# Patient Record
Sex: Female | Born: 1995 | Race: Black or African American | Hispanic: No | Marital: Single | Smoking: Former smoker
Health system: Southern US, Community
[De-identification: ages and names within clinical notes are randomized; demographics above are authoritative.]

## PROBLEM LIST (undated history)

## (undated) DIAGNOSIS — J45909 Unspecified asthma, uncomplicated: Secondary | ICD-10-CM

---

## 2019-08-29 ENCOUNTER — Inpatient Hospital Stay (HOSPITAL_COMMUNITY)
Admission: EM | Admit: 2019-08-29 | Discharge: 2019-09-04 | DRG: 869 | Disposition: A | Discharge status: Home Health | Payer: BLUE CROSS/BLUE SHIELD | Attending: Family Medicine | Admitting: Family Medicine

## 2019-08-29 ENCOUNTER — Encounter (HOSPITAL_COMMUNITY): Payer: Self-pay | Admitting: *Deleted

## 2019-08-29 ENCOUNTER — Other Ambulatory Visit: Payer: Self-pay

## 2019-08-29 DIAGNOSIS — E876 Hypokalemia: Secondary | ICD-10-CM | POA: Diagnosis not present

## 2019-08-29 DIAGNOSIS — Z20822 Contact with and (suspected) exposure to covid-19: Secondary | ICD-10-CM | POA: Diagnosis present

## 2019-08-29 DIAGNOSIS — X500XXA Overexertion from strenuous movement or load, initial encounter: Secondary | ICD-10-CM

## 2019-08-29 DIAGNOSIS — A5489 Other gonococcal infections: Principal | ICD-10-CM | POA: Diagnosis present

## 2019-08-29 DIAGNOSIS — J45909 Unspecified asthma, uncomplicated: Secondary | ICD-10-CM

## 2019-08-29 DIAGNOSIS — R52 Pain, unspecified: Secondary | ICD-10-CM

## 2019-08-29 DIAGNOSIS — M2550 Pain in unspecified joint: Secondary | ICD-10-CM

## 2019-08-29 DIAGNOSIS — M659 Synovitis and tenosynovitis, unspecified: Secondary | ICD-10-CM | POA: Diagnosis present

## 2019-08-29 DIAGNOSIS — L989 Disorder of the skin and subcutaneous tissue, unspecified: Secondary | ICD-10-CM

## 2019-08-29 DIAGNOSIS — R262 Difficulty in walking, not elsewhere classified: Secondary | ICD-10-CM | POA: Diagnosis present

## 2019-08-29 DIAGNOSIS — A549 Gonococcal infection, unspecified: Secondary | ICD-10-CM

## 2019-08-29 DIAGNOSIS — Y93F2 Activity, caregiving, lifting: Secondary | ICD-10-CM

## 2019-08-29 DIAGNOSIS — S46912A Strain of unspecified muscle, fascia and tendon at shoulder and upper arm level, left arm, initial encounter: Secondary | ICD-10-CM | POA: Diagnosis present

## 2019-08-29 DIAGNOSIS — J4520 Mild intermittent asthma, uncomplicated: Secondary | ICD-10-CM | POA: Diagnosis present

## 2019-08-29 DIAGNOSIS — A419 Sepsis, unspecified organism: Secondary | ICD-10-CM

## 2019-08-29 DIAGNOSIS — M25462 Effusion, left knee: Secondary | ICD-10-CM

## 2019-08-29 DIAGNOSIS — M25562 Pain in left knee: Secondary | ICD-10-CM

## 2019-08-29 DIAGNOSIS — A5409 Other gonococcal infection of lower genitourinary tract: Secondary | ICD-10-CM | POA: Diagnosis present

## 2019-08-29 HISTORY — DX: Unspecified asthma, uncomplicated: J45.909

## 2019-08-29 LAB — COMPREHENSIVE METABOLIC PANEL
ALT: 64 U/L — ABNORMAL HIGH (ref 0–44)
AST: 41 U/L (ref 15–41)
Albumin: 3.8 g/dL (ref 3.5–5.0)
Alkaline Phosphatase: 89 U/L (ref 38–126)
Anion gap: 10 (ref 5–15)
BUN: 9 mg/dL (ref 6–20)
CO2: 25 mmol/L (ref 22–32)
Calcium: 8.7 mg/dL — ABNORMAL LOW (ref 8.9–10.3)
Chloride: 102 mmol/L (ref 98–111)
Creatinine, Ser: 0.63 mg/dL (ref 0.44–1.00)
GFR calc Af Amer: >60 mL/min (ref >60)
GFR calc non Af Amer: >60 mL/min (ref >60)
Glucose, Bld: 84 mg/dL (ref 70–99)
Potassium: 3.2 mmol/L — ABNORMAL LOW (ref 3.5–5.1)
Sodium: 137 mmol/L (ref 135–145)
Total Bilirubin: 0.8 mg/dL (ref 0.3–1.2)
Total Protein: 7.6 g/dL (ref 6.5–8.1)

## 2019-08-29 LAB — CBC WITH DIFFERENTIAL/PLATELET
Abs Immature Granulocytes: 0.1 10*3/uL — ABNORMAL HIGH (ref 0.00–0.07)
Basophils Absolute: 0 10*3/uL (ref 0.0–0.1)
Basophils Relative: 0 %
Eosinophils Absolute: 0 10*3/uL (ref 0.0–0.5)
Eosinophils Relative: 0 %
HCT: 40.1 % (ref 36.0–46.0)
Hemoglobin: 13.5 g/dL (ref 12.0–15.0)
Immature Granulocytes: 1 %
Lymphocytes Relative: 14 %
Lymphs Abs: 1.5 10*3/uL (ref 0.7–4.0)
MCH: 30.3 pg (ref 26.0–34.0)
MCHC: 33.7 g/dL (ref 30.0–36.0)
MCV: 90.1 fL (ref 80.0–100.0)
Monocytes Absolute: 0.9 10*3/uL (ref 0.1–1.0)
Monocytes Relative: 9 %
Neutro Abs: 8.2 10*3/uL — ABNORMAL HIGH (ref 1.7–7.7)
Neutrophils Relative %: 76 %
Platelets: 216 10*3/uL (ref 150–400)
RBC: 4.45 MIL/uL (ref 3.87–5.11)
RDW: 11.2 % — ABNORMAL LOW (ref 11.5–15.5)
WBC: 10.7 10*3/uL — ABNORMAL HIGH (ref 4.0–10.5)
nRBC: 0 % (ref 0.0–0.2)

## 2019-08-29 LAB — HCG, QUANTITATIVE, PREGNANCY: hCG, Beta Chain, Quant, S: <1 m[IU]/mL (ref <5)

## 2019-08-29 LAB — URINALYSIS, MICROSCOPIC (REFLEX)
Bacteria, UA: NONE SEEN
RBC / HPF: NONE SEEN RBC/hpf (ref 0–5)

## 2019-08-29 LAB — HIV ANTIBODY (ROUTINE TESTING W REFLEX): HIV Screen 4th Generation wRfx: NONREACTIVE

## 2019-08-29 LAB — URINALYSIS, ROUTINE W REFLEX MICROSCOPIC
Glucose, UA: NEGATIVE mg/dL
Hgb urine dipstick: NEGATIVE
Ketones, ur: >80 mg/dL — AB
Nitrite: NEGATIVE
Specific Gravity, Urine: 1.02 (ref 1.005–1.030)
pH: 6.5 (ref 5.0–8.0)

## 2019-08-29 LAB — CK: Total CK: 38 U/L (ref 38–234)

## 2019-08-29 LAB — WET PREP, GENITAL
Sperm: NONE SEEN
Trich, Wet Prep: NONE SEEN
Yeast Wet Prep HPF POC: NONE SEEN

## 2019-08-29 MED ORDER — SODIUM CHLORIDE 0.9 % IV SOLN
1.0000 g | Freq: Once | INTRAVENOUS | Status: AC
  Administered 2019-08-29: 21:00:00 1 g via INTRAVENOUS
  Filled 2019-08-29: qty 10

## 2019-08-29 MED ORDER — SENNOSIDES-DOCUSATE SODIUM 8.6-50 MG PO TABS: 1.0000 | ORAL_TABLET | Freq: Every evening | ORAL | Status: DC | PRN

## 2019-08-29 MED ORDER — SODIUM CHLORIDE 0.9 % IV SOLN: INTRAVENOUS | Status: AC

## 2019-08-29 MED ORDER — ONDANSETRON HCL 4 MG/2ML IJ SOLN: 4.0000 mg | Freq: Four times a day (QID) | INTRAMUSCULAR | Status: DC | PRN

## 2019-08-29 MED ORDER — FENTANYL CITRATE (PF) 100 MCG/2ML IJ SOLN
50.0000 ug | Freq: Once | INTRAMUSCULAR | Status: AC
  Administered 2019-08-29: 18:00:00 50 ug via INTRAVENOUS
  Filled 2019-08-29: qty 2

## 2019-08-29 MED ORDER — POTASSIUM CHLORIDE CRYS ER 20 MEQ PO TBCR
40.0000 meq | EXTENDED_RELEASE_TABLET | Freq: Once | ORAL | Status: AC
  Administered 2019-08-29: 21:00:00 40 meq via ORAL
  Filled 2019-08-29: qty 2

## 2019-08-29 MED ORDER — SODIUM CHLORIDE 0.9 % IV BOLUS
1000.0000 mL | Freq: Once | INTRAVENOUS | Status: AC
  Administered 2019-08-29: 20:00:00 1000 mL via INTRAVENOUS

## 2019-08-29 MED ORDER — SODIUM CHLORIDE 0.9 % IV BOLUS
1000.0000 mL | Freq: Once | INTRAVENOUS | Status: AC
  Administered 2019-08-29: 18:00:00 1000 mL via INTRAVENOUS

## 2019-08-29 MED ORDER — SODIUM CHLORIDE 0.9 % IV SOLN
1.0000 g | INTRAVENOUS | Status: DC
  Administered 2019-08-30: 1 g via INTRAVENOUS
  Filled 2019-08-29: qty 1
  Filled 2019-08-29: qty 10

## 2019-08-29 MED ORDER — ONDANSETRON HCL 4 MG PO TABS
4.0000 mg | ORAL_TABLET | Freq: Four times a day (QID) | ORAL | Status: DC | PRN
  Filled 2019-08-29: qty 1

## 2019-08-29 MED ORDER — HYDROCODONE-ACETAMINOPHEN 5-325 MG PO TABS
1.0000 | ORAL_TABLET | ORAL | Status: DC | PRN
  Administered 2019-08-29 – 2019-08-30 (×5): 2 via ORAL
  Administered 2019-08-31: 1 via ORAL
  Administered 2019-08-31 (×3): 2 via ORAL
  Administered 2019-08-31: 1 via ORAL
  Administered 2019-09-01 (×2): 2 via ORAL
  Filled 2019-08-29 (×11): qty 2

## 2019-08-29 MED ORDER — ACETAMINOPHEN 325 MG PO TABS
650.0000 mg | ORAL_TABLET | Freq: Four times a day (QID) | ORAL | Status: DC | PRN
  Administered 2019-09-02 – 2019-09-04 (×3): 650 mg via ORAL
  Filled 2019-08-29 (×3): qty 2

## 2019-08-29 MED ORDER — ACETAMINOPHEN 650 MG RE SUPP: 650.0000 mg | Freq: Four times a day (QID) | RECTAL | Status: DC | PRN

## 2019-08-29 MED ORDER — ALBUTEROL SULFATE (2.5 MG/3ML) 0.083% IN NEBU: 2.5000 mg | INHALATION_SOLUTION | Freq: Four times a day (QID) | RESPIRATORY_TRACT | Status: DC | PRN

## 2019-08-29 MED ORDER — MORPHINE SULFATE (PF) 4 MG/ML IV SOLN
4.0000 mg | INTRAVENOUS | Status: AC | PRN
  Administered 2019-08-30 – 2019-08-31 (×2): 4 mg via INTRAVENOUS
  Filled 2019-08-29 (×2): qty 1

## 2019-08-29 MED ORDER — ACETAMINOPHEN 325 MG PO TABS
650.0000 mg | ORAL_TABLET | Freq: Once | ORAL | Status: AC
  Administered 2019-08-29: 650 mg via ORAL
  Filled 2019-08-29: qty 2

## 2019-08-29 MED ORDER — DOXYCYCLINE HYCLATE 100 MG PO TABS
100.0000 mg | ORAL_TABLET | Freq: Two times a day (BID) | ORAL | Status: DC
  Administered 2019-08-29 – 2019-09-01 (×7): 100 mg via ORAL
  Filled 2019-08-29 (×10): qty 1

## 2019-08-29 MED ORDER — LORAZEPAM 0.5 MG PO TABS
0.5000 mg | ORAL_TABLET | Freq: Once | ORAL | Status: AC
  Administered 2019-08-29: 17:00:00 0.5 mg via ORAL
  Filled 2019-08-29: qty 1

## 2019-08-29 MED ORDER — FENTANYL CITRATE (PF) 100 MCG/2ML IJ SOLN
50.0000 ug | Freq: Once | INTRAMUSCULAR | Status: AC
  Administered 2019-08-29: 21:00:00 50 ug via INTRAVENOUS
  Filled 2019-08-29: qty 2

## 2019-08-29 MED ORDER — ALBUTEROL SULFATE HFA 108 (90 BASE) MCG/ACT IN AERS: 2.0000 | INHALATION_SPRAY | Freq: Four times a day (QID) | RESPIRATORY_TRACT | Status: DC | PRN

## 2019-08-29 NOTE — ED Notes (Signed)
ED TO INPATIENT HANDOFF REPORT  ED Nurse Name and Phone #: Clarise Cruz [phone]  S Name/Age/Gender Nancy Hudson 24 y.o. female Room/Bed: WA24/WA24  Code Status   Code Status: Not on file  Home/SNF/Other Home Patient oriented to: self, place, time and situation Is this baseline? Yes   Triage Complete: Triage complete  Chief Complaint Sepsis [facility]) [A41.9]  Triage Note BIB EMS due to ? Allergic reaction to Diclofenac or Flexeril, Started on Tuesday due to shoulder injury, on Wednesday noticed clear pustular bumps on ext only, also reports joint pain, random joint swelling. Itching and burning. No SHOB. 134/90-100-20-100%     Allergies No Known Allergies  Level of Care/Admitting Diagnosis ED Disposition    ED Disposition Condition Comment   Admit  Hospital Area: [facility] [[id number]]  Level of Care: Med-Surg [16]  Covid Evaluation: Asymptomatic Screening Protocol (No Symptoms)  Diagnosis: Sepsis [facility]) [[id number]]  Admitting Physician: Vianne Bulls [[id number]]  Attending Physician: Vianne Bulls [[id number]]       B Medical/Surgery History Past Medical History:  Diagnosis Date  . Asthma    History reviewed. No pertinent surgical history.   A IV Location/Drains/Wounds Patient Lines/Drains/Airways Status   Active Line/Drains/Airways    Name:   Placement date:   Placement time:   Site:   Days:   Peripheral IV 08/29/19 Left Arm   08/29/19    1750    Arm   less than 1          Intake/Output Last 24 hours  Intake/Output Summary (Last 24 hours) at 08/29/2019 2140 Last data filed at 08/29/2019 2135 Gross per 24 hour  Intake 2103.62 ml  Output --  Net 2103.62 ml    Labs/Imaging Results for orders placed or performed during the hospital encounter of 08/29/19 (from the past 48 hour(s))  Wet prep, genital     Status: Abnormal   Collection Time: 08/29/19  5:08 PM   Specimen: PATH Cytology Cervicovaginal Ancillary Only  Result Value Ref Range   Yeast  Wet Prep HPF POC NONE SEEN NONE SEEN   Trich, Wet Prep NONE SEEN NONE SEEN   Clue Cells Wet Prep HPF POC PRESENT (A) NONE SEEN   WBC, Wet Prep HPF POC FEW (A) NONE SEEN   Sperm NONE SEEN     Comment: Performed at [facility], [place] [address] [place], [place] [address]  Comprehensive metabolic panel     Status: Abnormal   Collection Time: 08/29/19  5:15 PM  Result Value Ref Range   Sodium 137 135 - 145 mmol/L   Potassium 3.2 (L) 3.5 - 5.1 mmol/L   Chloride 102 98 - 111 mmol/L   CO2 25 22 - 32 mmol/L   Glucose, Bld 84 70 - 99 mg/dL    Comment: Glucose reference range applies only to samples taken after fasting for at least 8 hours.   BUN 9 6 - 20 mg/dL   Creatinine, Ser 0.63 0.44 - 1.00 mg/dL   Calcium 8.7 (L) 8.9 - 10.3 mg/dL   Total Protein 7.6 6.5 - 8.1 g/dL   Albumin 3.8 3.5 - 5.0 g/dL   AST 41 15 - 41 U/L   ALT 64 (H) 0 - 44 U/L   Alkaline Phosphatase 89 38 - 126 U/L   Total Bilirubin 0.8 0.3 - 1.2 mg/dL   GFR calc non Af Amer >60 >60 mL/min   GFR calc Af Amer >60 >60 mL/min   Anion gap 10 5 -  15    Comment: Performed at [facility], [address] [place], [place] [address]  CBC with Differential     Status: Abnormal   Collection Time: 08/29/19  5:15 PM  Result Value Ref Range   WBC 10.7 (H) 4.0 - 10.5 K/uL   RBC 4.45 3.87 - 5.11 MIL/uL   Hemoglobin 13.5 12.0 - 15.0 g/dL   HCT 58.0 99.8 - 33.8 %   MCV 90.1 80.0 - 100.0 fL   MCH 30.3 26.0 - 34.0 pg   MCHC 33.7 30.0 - 36.0 g/dL   RDW 25.0 (L) 53.9 - 76.7 %   Platelets 216 150 - 400 K/uL   nRBC 0.0 0.0 - 0.2 %   Neutrophils Relative % 76 %   Neutro Abs 8.2 (H) 1.7 - 7.7 K/uL   Lymphocytes Relative 14 %   Lymphs Abs 1.5 0.7 - 4.0 K/uL   Monocytes Relative 9 %   Monocytes Absolute 0.9 0.1 - 1.0 K/uL   Eosinophils Relative 0 %   Eosinophils Absolute 0.0 0.0 - 0.5 K/uL   Basophils Relative 0 %   Basophils Absolute 0.0 0.0 - 0.1 K/uL   Immature Granulocytes 1 %   Abs Immature  Granulocytes 0.10 (H) 0.00 - 0.07 K/uL    Comment: Performed at [facility], [address], [place], [place] [address]  CK     Status: None   Collection Time: 08/29/19  5:15 PM  Result Value Ref Range   Total CK 38 38 - 234 U/L    Comment: Performed at [facility], [address], [place], [place] [address]  HIV Antibody (routine testing w rflx)     Status: None   Collection Time: 08/29/19  5:15 PM  Result Value Ref Range   HIV Screen 4th Generation wRfx NON REACTIVE NON REACTIVE    Comment: Performed at [facility], [address] [place], [place] [address]  hCG, quantitative, pregnancy     Status: None   Collection Time: 08/29/19  5:43 PM  Result Value Ref Range   hCG, Beta Chain, Quant, S <1 <5 mIU/mL    Comment:          GEST. AGE      CONC.  (mIU/mL)   <=1 WEEK        5 - 50     2 WEEKS       50 - 500     3 WEEKS       100 - 10,000     4 WEEKS     1,000 - 30,000     5 WEEKS     3,500 - 115,000   6-8 WEEKS     12,000 - 270,000    12 WEEKS     15,000 - 220,000        FEMALE AND NON-PREGNANT FEMALE:     LESS THAN 5 mIU/mL Performed at [facility], [address], [place], [place] [address]   Urinalysis, Routine w reflex microscopic     Status: Abnormal   Collection Time: 08/29/19  6:26 PM  Result Value Ref Range   Color, Urine YELLOW YELLOW   APPearance HAZY (A) CLEAR   Specific Gravity, Urine 1.020 1.005 - 1.030   pH 6.5 5.0 - 8.0   Glucose, UA NEGATIVE NEGATIVE mg/dL   Hgb urine dipstick NEGATIVE NEGATIVE   Bilirubin Urine SMALL (A) NEGATIVE   Ketones, ur >80 (A) NEGATIVE mg/dL  Protein, ur TRACE (A) NEGATIVE mg/dL   Nitrite NEGATIVE NEGATIVE   Leukocytes,Ua SMALL (A) NEGATIVE    Comment: Performed at [facility], [address] [place], [place] [address]  Urinalysis, Microscopic (reflex)     Status: Abnormal   Collection Time: 08/29/19  6:26 PM  Result Value Ref Range   RBC /  HPF NONE SEEN 0 - 5 RBC/hpf   WBC, UA 11-20 0 - 5 WBC/hpf   Bacteria, UA NONE SEEN NONE SEEN   Squamous Epithelial / LPF 6-10 0 - 5   Non Squamous Epithelial PRESENT (A) NONE SEEN   Amorphous Crystal PRESENT     Comment: Performed at [facility], [address], [place], [place] [address]   No results found.  Pending Labs [employer] (From admission, onward)    Start     Ordered   08/29/19 2007  SARS CORONAVIRUS 2 (TAT 6-24 HRS) Nasopharyngeal Nasopharyngeal Swab  (Tier 3 (TAT 6-24 hrs))  Once,   STAT    Question Answer Comment  Is this test for diagnosis or screening Screening   Symptomatic for COVID-19 as defined by CDC No   Hospitalized for COVID-19 No   Admitted to ICU for COVID-19 No   Previously tested for COVID-19 No   Resident in a congregate (group) care setting No   Employed in healthcare setting No   Pregnant No      08/29/19 2018   08/29/19 1642  Blood culture (routine x 2)  BLOOD CULTURE X 2,   STAT     08/29/19 1641   08/29/19 1633  RPR  ONCE - STAT,   STAT     08/29/19 1633   Signed and Held  Comprehensive metabolic panel  Tomorrow morning,   R     Signed and Held   Signed and Held  CBC WITH DIFFERENTIAL  Tomorrow morning,   R     Signed and Held          Vitals/Pain Today's Vitals   08/29/19 2000 08/29/19 2100 08/29/19 2130 08/29/19 2135  BP: (!) 128/94 (!) 134/99 (!) 137/105   Pulse: 100 (!) 105 90   Resp: (!) 25 20 15    Temp:      TempSrc:      SpO2: 100% 98% 100%   Height:      PainSc:    6     Isolation Precautions No active isolations  Medications Medications  cefTRIAXone (ROCEPHIN) 1 g in sodium chloride 0.9 % 100 mL IVPB (has no administration in time range)  doxycycline (VIBRA-TABS) tablet 100 mg (has no administration in time range)  fentaNYL (SUBLIMAZE) injection 50 mcg (50 mcg Intravenous Given 08/29/19 1816)  acetaminophen (TYLENOL) tablet 650 mg (650 mg Oral Given 08/29/19 1721)  sodium chloride 0.9 % bolus  1,000 mL (0 mLs Intravenous Stopped 08/29/19 2007)  LORazepam (ATIVAN) tablet 0.5 mg (0.5 mg Oral Given 08/29/19 1721)  sodium chloride 0.9 % bolus 1,000 mL (0 mLs Intravenous Stopped 08/29/19 2042)  cefTRIAXone (ROCEPHIN) 1 g in sodium chloride 0.9 % 100 mL IVPB ( Intravenous Stopped 08/29/19 2119)  fentaNYL (SUBLIMAZE) injection 50 mcg (50 mcg Intravenous Given 08/29/19 2050)  potassium chloride SA (KLOR-CON) CR tablet 40 mEq (40 mEq Oral Given 08/29/19 2051)    Mobility non-ambulatory Low fall risk   Focused Assessments    R Recommendations: See Admitting Provider Note  Report given to:   Additional Notes: Purewick present

## 2019-08-29 NOTE — ED Notes (Signed)
PA at bedside.

## 2019-08-29 NOTE — ED Triage Notes (Signed)
BIB EMS due to ? Allergic reaction to Diclofenac or Flexeril, Started on Tuesday due to shoulder injury, on Wednesday noticed clear pustular bumps on ext only, also reports joint pain, random joint swelling. Itching and burning. No SHOB. 134/90-100-20-100%

## 2019-08-29 NOTE — ED Notes (Signed)
Attempted to call report, nurse stated she needed a few more minutes

## 2019-08-29 NOTE — H&P (Signed)
History and Physical    Nancy Hudson ZHG:[id number] DOB: [birth year] DOA: 08/29/2019  PCP: Patient, No Pcp Per   Patient coming from: Home   Chief Complaint: Joint aches, rash, chills   HPI: Nancy Hudson is a 24 y.o. female with medical history significant for asthma and recent right shoulder strain, now presenting to emergency department with approximately 5 days of joint aches, rash, chills, and malaise.  Patient had just started diclofenac for a right shoulder strain when she noted small pustules appear over her elbow, knee, hand, and foot.  She went on to develop chills, general malaise, and diffuse arthralgias.  She denies any cough, dysuria, abdominal or flank pain, or vaginal discharge.  ED Course: Upon arrival to the ED, patient is found to be febrile to 38.1 C, saturating well on room air, slightly tachycardic, and with stable blood pressure.  Chemistry panel notable for potassium 3.2 and CBC with leukocytosis to 10,700.  Blood cultures were collected and the patient was treated with oral potassium, 2 L of saline, 1 g of Rocephin.  Covid PCR, HIV, and RPR were ordered from the ED and a urogenital swab was sent for gonorrhea and Chlamydia testing.  Infectious disease was consulted by the ED physician and medical admission was recommended for ongoing evaluation and management.  Review of Systems:  All other systems reviewed and apart from HPI, are negative.  Past Medical History:  Diagnosis Date  . Asthma     History reviewed. No pertinent surgical history.   has no history on file for tobacco, alcohol, and drug.  No Known Allergies  History reviewed. No pertinent family history.   Prior to Admission medications   Medication Sig Start Date End Date Taking? Authorizing Provider  acetaminophen (TYLENOL) 500 MG tablet Take 500 mg by mouth every 6 (six) hours as needed for mild pain or moderate pain.   Yes [provider]  cetirizine (ZYRTEC) 10 MG tablet Take 10 mg by  mouth daily as needed for allergies.   Yes [provider]  cyclobenzaprine (FLEXERIL) 10 MG tablet Take 10 mg by mouth 3 (three) times daily as needed for muscle spasms.  08/25/19  Yes [provider]  diclofenac (CATAFLAM) 50 MG tablet Take 50 mg by mouth 3 (three) times daily as needed for pain. 08/25/19  Yes [provider]  diphenhydrAMINE HCl (BENADRYL ALLERGY PO) Take 1 Dose by mouth every 4 (four) hours as needed (itching, allergic reaction).    Yes [provider]  fluticasone (FLONASE) 50 MCG/ACT nasal spray Place 1-2 sprays into both nostrils daily as needed for allergies.   Yes [provider]    Physical Exam: Vitals:   08/29/19 1930 08/29/19 2000 08/29/19 2100 08/29/19 2130  BP:  (!) 128/94 (!) 134/99 (!) 137/105  Pulse:  100 (!) 105 90  Resp:  (!) 25 20 15   Temp:      TempSrc:      SpO2:  100% 98% 100%  Height: 5\' 2"  (1.575 m)        Constitutional: NAD, calm  Eyes: PERTLA, lids and conjunctivae normal ENMT: Mucous membranes are moist. Posterior pharynx clear of any exudate or lesions.   Neck: normal, supple, no masses, no thyromegaly Respiratory:  no wheezing, no crackles. No accessory muscle use.  Cardiovascular: S1 & S2 heard, regular rate and rhythm. No extremity edema.  Abdomen: No distension, no tenderness, soft. Bowel sounds active.  Musculoskeletal: no clubbing / cyanosis. Knee and elbow tenderness without obvious  effusions, PROM intact.   Skin: scattered tender and necrotic pustules. Warm, dry, well-perfused. Neurologic: CN 2-12 grossly intact. Sensation intact. Strength 5/5 in all 4 limbs.  Psychiatric: Alert and oriented x 3. Pleasant and cooperative.    Labs and Imaging on Admission: I have personally reviewed following labs and imaging studies  CBC: Recent Labs  Lab 08/29/19 1715  WBC 10.7*  NEUTROABS 8.2*  HGB 13.5  HCT 40.1  MCV 90.1  PLT 216   Basic Metabolic Panel: Recent Labs  Lab 08/29/19 1715    NA 137  K 3.2*  CL 102  CO2 25  GLUCOSE 84  BUN 9  CREATININE 0.63  CALCIUM 8.7*   GFR: CrCl cannot be calculated (Unknown ideal weight.). Liver Function Tests: Recent Labs  Lab 08/29/19 1715  AST 41  ALT 64*  ALKPHOS 89  BILITOT 0.8  PROT 7.6  ALBUMIN 3.8   No results for input(s): LIPASE, AMYLASE in the last 168 hours. No results for input(s): AMMONIA in the last 168 hours. Coagulation Profile: No results for input(s): INR, PROTIME in the last 168 hours. Cardiac Enzymes: Recent Labs  Lab 08/29/19 1715  CKTOTAL 38   BNP (last 3 results) No results for input(s): PROBNP in the last 8760 hours. HbA1C: No results for input(s): HGBA1C in the last 72 hours. CBG: No results for input(s): GLUCAP in the last 168 hours. Lipid Profile: No results for input(s): CHOL, HDL, LDLCALC, TRIG, CHOLHDL, LDLDIRECT in the last 72 hours. Thyroid Function Tests: No results for input(s): TSH, T4TOTAL, FREET4, T3FREE, THYROIDAB in the last 72 hours. Anemia Panel: No results for input(s): VITAMINB12, FOLATE, FERRITIN, TIBC, IRON, RETICCTPCT in the last 72 hours. Urine analysis:    Component Value Date/Time   COLORURINE YELLOW 08/29/2019 1826   APPEARANCEUR HAZY (A) 08/29/2019 1826   LABSPEC 1.020 08/29/2019 1826   PHURINE 6.5 08/29/2019 1826   GLUCOSEU NEGATIVE 08/29/2019 1826   HGBUR NEGATIVE 08/29/2019 1826   BILIRUBINUR SMALL (A) 08/29/2019 1826   KETONESUR >80 (A) 08/29/2019 1826   PROTEINUR TRACE (A) 08/29/2019 1826   NITRITE NEGATIVE 08/29/2019 1826   LEUKOCYTESUR SMALL (A) 08/29/2019 1826   Sepsis Labs: @LABRCNTIP (procalcitonin:4,lacticidven:4) ) Recent Results (from the past 240 hour(s))  Wet prep, genital     Status: Abnormal   Collection Time: 08/29/19  5:08 PM   Specimen: PATH Cytology Cervicovaginal Ancillary Only  Result Value Ref Range Status   Yeast Wet Prep HPF POC NONE SEEN NONE SEEN Final   Trich, Wet Prep NONE SEEN NONE SEEN Final   Clue Cells Wet Prep  HPF POC PRESENT (A) NONE SEEN Final   WBC, Wet Prep HPF POC FEW (A) NONE SEEN Final   Sperm NONE SEEN  Final    Comment: Performed at [facility], [address], [place], [place]     Radiological Exams on Admission: No results found.  Assessment/Plan  1. Sepsis with rash and polyarthralgia  - Presents with 4 days of fevers, polyarthralgia, and rash and is found to have fever, tachycardia, leukocytosis, tenosynovitis, and pustular skin lesions most concerning for disseminated gonococcus  - Blood cultures were collected in ED, vaginal swab sent for gonorrhea and chlamydia, HIV and RPR sent, ID consulted by ED physician, and empiric Rocephin was started  - Continue Rocephin, start doxycycline 100 mg BID, follow pending micro studies and clinical course    2. Hypokalemia  - Replaced in ED, repeat chem panel in am   3. Asthma  -  Stable, continue albuterol as needed     DVT prophylaxis: SCDs Code Status: Full  Family Communication: Discussed with patient  Disposition Plan: Possibly home in 1-2 days if fevers and joint pains abate and she is able to tolerate oral antibiotic  Consults called: ID consulted by ED physician Admission status: observation    Vianne Bulls, MD [facility] Pager: See [email or web address]  If 7AM-7PM, please contact the daytime attending [email or web address]  08/29/2019, 10:27 PM

## 2019-08-29 NOTE — ED Provider Notes (Signed)
[facility]-EMERGENCY DEPT Provider Note   CSN: [id number] Arrival date & time: 08/29/19  1518     History Chief Complaint  Patient presents with  . Rash    Nancy Hudson is a 24 y.o. female with a history of asthma who presents to the ED with complaints of rash and joint pain x 4 days. Patient states that she has a painful rash with blistering to variable locations of the upper extremities (R palm, R knee, L elbow, L anterior shin, L heal). States the areas feel like they are burning, worse with palpation and movement. She has associated generalized arthralgias to the extremities x 4 with feeling of generalized weakness as well as some nausea and decreased appetite. Her sxs are worse with attempts to move, she is minimizing ambulation only to go to the bathroom. 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 Overall sxs are progressively worsening. Denies fever, chills, night sweats, weight loss, abdominal pain, chest pain, dyspnea, throat closing sensation, numbness, or focal weakness. She is sexually active in a monogamous relationship with 1 partner.  HPI     Past Medical History:  Diagnosis Date  . Asthma     There are no problems to display for this patient.   History reviewed. No pertinent surgical history.   OB History   No obstetric history on file.     No family history on file.  Social History   Tobacco Use  . Smoking status: Not on file  Substance Use Topics  . Alcohol use: Not on file  . Drug use: Not on file    Home Medications Prior to Admission medications   Not on File    Allergies    Patient has no known allergies.  Review of Systems   Review of Systems  Constitutional: Positive for  appetite change. Negative for chills and fever.  Respiratory: Negative for shortness of breath.   Cardiovascular: Negative for chest pain.  Gastrointestinal: Positive for nausea. Negative for abdominal pain, blood in stool, constipation, diarrhea and vomiting.  Genitourinary: Negative for dysuria.  Musculoskeletal: Positive for arthralgias.  Skin: Positive for rash.  Neurological: Positive for weakness (generalized). Negative for syncope.  All other systems reviewed and are negative.   Physical Exam Updated Vital Signs BP (!) 144/112 (BP Location: Left Arm)   Pulse (!) 101   Temp 99.6 F (37.6 C) (Oral)   Resp 16   SpO2 100%   Physical Exam Vitals and nursing note reviewed.  Constitutional:      General: She is not in acute distress.    Appearance: She is well-developed. She is not toxic-appearing.  HENT:     Head: Normocephalic and atraumatic.  Eyes:     General:        Right eye: No discharge.        Left eye: No discharge.     Conjunctiva/sclera: Conjunctivae normal.  Cardiovascular:     Rate and Rhythm: Normal rate and regular rhythm.     Pulses:          Radial pulses are 2+ on the right side and 2+ on the left side.       Dorsalis pedis pulses are 2+ on the  right side and 2+ on the left side.  Pulmonary:     Effort: Pulmonary effort is normal. No respiratory distress.     Breath sounds: Normal breath sounds. No wheezing, rhonchi or rales.  Abdominal:     General: There is no distension.     Palpations: Abdomen is soft.     Tenderness: There is no abdominal tenderness. There is no guarding or rebound.  Musculoskeletal:     Cervical back: Neck supple.     Comments: Upper extremities: Patient uncomfortable with attempts to actively range all joints. Able to fully range the LUE. R shoulder limitation- able to move minimally. She has diffuse tenderness, worse over areas of skin lesions.  Lower extremities: Patient uncomfortable with attempts to actively range all  joints- more so in the LLE. Able to fully range the RLE, LLE hip/knee limited secondary to pain. Tender to palpation diffusely, most prominent over skin lesions.   No specific joint is overall erythematous/hot to the touch.   Skin:    General: Skin is warm and dry.     Comments: Patient has multiple areas of erythema with central somewhat necrotic appearing tissue, some blistering areas present, pictured below. Each of these areas is tender to palpation. No obvious fluctuance. Locations include the palmar base and the dorsal distal phlanx of the R 3rd digit, R anterior knee, L anterior proximal lower leg, L elbow posteriorly, and then a small area of erythema to the L plantar heal.   Neurological:     Mental Status: She is alert.     Comments: Clear speech. Sensation grossly intact x 4. Generalized weakness noted throughout.   Psychiatric:        Behavior: Behavior normal.                   ED Results / Procedures / Treatments   Labs (all labs ordered are listed, but only abnormal results are displayed) Labs Reviewed  WET PREP, GENITAL - Abnormal; Notable for the following components:      Result Value   Clue Cells Wet Prep HPF POC PRESENT (*)    WBC, Wet Prep HPF POC FEW (*)    All other components within normal limits  COMPREHENSIVE METABOLIC PANEL - Abnormal; Notable for the following components:   Potassium 3.2 (*)    Calcium 8.7 (*)    ALT 64 (*)    All other components within normal limits  CBC WITH DIFFERENTIAL/PLATELET - Abnormal; Notable for the following components:   WBC 10.7 (*)    RDW 11.2 (*)    Neutro Abs 8.2 (*)    Abs Immature Granulocytes 0.10 (*)    All other components within normal limits  URINALYSIS, ROUTINE W REFLEX MICROSCOPIC - Abnormal; Notable for the following components:   APPearance HAZY (*)    Bilirubin Urine SMALL (*)    Ketones, ur >80 (*)    Protein, ur TRACE (*)    Leukocytes,Ua SMALL (*)    All other components within normal  limits  URINALYSIS, MICROSCOPIC (REFLEX) - Abnormal; Notable for the following components:   Non Squamous Epithelial PRESENT (*)    All other components within normal limits  CULTURE, BLOOD (ROUTINE X 2)  CULTURE, BLOOD (ROUTINE X 2)  CK  HCG, QUANTITATIVE, PREGNANCY  RPR  HIV ANTIBODY (ROUTINE TESTING W REFLEX)  GC/CHLAMYDIA PROBE AMP ([place]) NOT AT [facility]    EKG None  Radiology No results found.  Procedures Procedures (including critical care time)  Medications Ordered in ED Medications  cefTRIAXone (ROCEPHIN) 1 g in sodium chloride 0.9 % 100 mL IVPB (has no administration in time range)  doxycycline (VIBRA-TABS) tablet 100 mg (has no administration in time range)  fentaNYL (SUBLIMAZE) injection 50 mcg (50 mcg Intravenous Given 08/29/19 1816)  acetaminophen (TYLENOL) tablet 650 mg (650 mg Oral Given 08/29/19 1721)  sodium chloride 0.9 % bolus 1,000 mL (0 mLs Intravenous Stopped 08/29/19 2007)  LORazepam (ATIVAN) tablet 0.5 mg (0.5 mg Oral Given 08/29/19 1721)  sodium chloride 0.9 % bolus 1,000 mL (0 mLs Intravenous Stopped 08/29/19 2042)  cefTRIAXone (ROCEPHIN) 1 g in sodium chloride 0.9 % 100 mL IVPB ( Intravenous Stopped 08/29/19 2119)  fentaNYL (SUBLIMAZE) injection 50 mcg (50 mcg Intravenous Given 08/29/19 2050)  potassium chloride SA (KLOR-CON) CR tablet 40 mEq (40 mEq Oral Given 08/29/19 2051)    ED Course  I have reviewed the triage vital signs and the nursing notes.  Pertinent labs & imaging results that were available during my care of the patient were reviewed by me and considered in my medical decision making (see chart for details).    Marni Franzoni was evaluated in Emergency Department on 08/29/2019 for the symptoms described in the history of present illness. He/she was evaluated in the context of the global COVID-19 pandemic, which necessitated consideration that the patient might be at risk for infection with the SARS-CoV-2 virus that causes COVID-19.  Institutional protocols and algorithms that pertain to the evaluation of patients at risk for COVID-19 are in a state of rapid change based on information released by regulatory bodies including the CDC and federal and state organizations. These policies and algorithms were followed during the patient's care in the ED.  MDM Rules/Calculators/A&P                      Patient presents to the ED with painful skin lesions and polyarthralgias, noted to be febrile on arrival with oral temperature of 100.6.  Mildly tachycardic.  Patient has diffuse tenderness throughout the upper and lower extremities with painful variable degree range of motion throughout.  No specific joint appears consistent with a septic joint definitively at this time.  Differential diagnosis includes: Disseminated gonococcal infection, syphilis, contact dermatitis, autoimmune disorder, rhabdo.  Rash does not appear urticarial.   Additional history provided by patient's cousin at bedside who confirms above as well as from review of nursing notes and prior visits.   Labs ordered, reviewed, and interpreted including: CBC: Mild leukocytosis at 10.7.  No anemia or thrombocytopenia. CMP: Mild hypokalemia and hypocalcemia, will give oral potassium in the ER.  Renal function preserved.  Mildly elevated ALT, AST and T bili are WNL. CK: WNL Urinalysis: Concerning for dehydration with ketonuria, also has protein and small leukocytes, there is a degree of contamination. Wet prep: Clue cells and WBCs.  No trichomoniasis or yeast. Pregnancy test is negative.  Concern for disseminated gonococcal infection based on presentation. Will discuss with infectious disease. Discussed concerns with patient who is aware of results & plan of care thus far.   19:43: CONSULT: Discussed with Dr. Ninetta Lights with infectious disease, has reviewed images, in agreement that some of these are concerning for disseminated gonococcal infection, states will show up in blood  cultures, in agreement with admission and starting IV rocephin in the interim. Appreciate consultation.   20:15: RE-EVAL: Patient agreeable to admission. Remains with discomfort, analgesics & rocephin ordered.   20:45: CONSULT: Discussed with hospitalist Dr. Antionette Char- accepts  admission.   This is a shared visit with supervising physician Dr. Ashok Cordia who has independently evaluated patient & provided guidance in evaluation/management/disposition, in agreement with care     Final Clinical Impression(s) / ED Diagnoses Final diagnoses:  Polyarthralgia  Skin lesions    Rx / DC Orders ED Discharge Orders    None       Leafy Kindle 08/29/19 2217    Lajean Saver, MD 08/30/19 1533

## 2019-08-30 ENCOUNTER — Observation Stay (HOSPITAL_COMMUNITY): Payer: BLUE CROSS/BLUE SHIELD

## 2019-08-30 DIAGNOSIS — A419 Sepsis, unspecified organism: Secondary | ICD-10-CM | POA: Diagnosis not present

## 2019-08-30 LAB — COMPREHENSIVE METABOLIC PANEL
ALT: 46 U/L — ABNORMAL HIGH (ref 0–44)
AST: 28 U/L (ref 15–41)
Albumin: 3 g/dL — ABNORMAL LOW (ref 3.5–5.0)
Alkaline Phosphatase: 71 U/L (ref 38–126)
Anion gap: 9 (ref 5–15)
BUN: 9 mg/dL (ref 6–20)
CO2: 21 mmol/L — ABNORMAL LOW (ref 22–32)
Calcium: 8 mg/dL — ABNORMAL LOW (ref 8.9–10.3)
Chloride: 106 mmol/L (ref 98–111)
Creatinine, Ser: 0.51 mg/dL (ref 0.44–1.00)
GFR calc Af Amer: >60 mL/min (ref >60)
GFR calc non Af Amer: >60 mL/min (ref >60)
Glucose, Bld: 72 mg/dL (ref 70–99)
Potassium: 3.3 mmol/L — ABNORMAL LOW (ref 3.5–5.1)
Sodium: 136 mmol/L (ref 135–145)
Total Bilirubin: 0.7 mg/dL (ref 0.3–1.2)
Total Protein: 6.2 g/dL — ABNORMAL LOW (ref 6.5–8.1)

## 2019-08-30 LAB — MAGNESIUM: Magnesium: 2 mg/dL (ref 1.7–2.4)

## 2019-08-30 LAB — CBC WITH DIFFERENTIAL/PLATELET
Abs Immature Granulocytes: 0.08 10*3/uL — ABNORMAL HIGH (ref 0.00–0.07)
Basophils Absolute: 0 10*3/uL (ref 0.0–0.1)
Basophils Relative: 0 %
Eosinophils Absolute: 0 10*3/uL (ref 0.0–0.5)
Eosinophils Relative: 0 %
HCT: 37.5 % (ref 36.0–46.0)
Hemoglobin: 12.5 g/dL (ref 12.0–15.0)
Immature Granulocytes: 1 %
Lymphocytes Relative: 24 %
Lymphs Abs: 2.3 10*3/uL (ref 0.7–4.0)
MCH: 30.4 pg (ref 26.0–34.0)
MCHC: 33.3 g/dL (ref 30.0–36.0)
MCV: 91.2 fL (ref 80.0–100.0)
Monocytes Absolute: 1 10*3/uL (ref 0.1–1.0)
Monocytes Relative: 10 %
Neutro Abs: 6.3 10*3/uL (ref 1.7–7.7)
Neutrophils Relative %: 65 %
Platelets: 218 10*3/uL (ref 150–400)
RBC: 4.11 MIL/uL (ref 3.87–5.11)
RDW: 11.4 % — ABNORMAL LOW (ref 11.5–15.5)
WBC: 9.7 10*3/uL (ref 4.0–10.5)
nRBC: 0 % (ref 0.0–0.2)

## 2019-08-30 LAB — SARS CORONAVIRUS 2 (TAT 6-24 HRS): SARS Coronavirus 2: NEGATIVE

## 2019-08-30 LAB — RPR: RPR Ser Ql: NONREACTIVE

## 2019-08-30 MED ORDER — POLYVINYL ALCOHOL 1.4 % OP SOLN
1.0000 [drp] | OPHTHALMIC | Status: DC | PRN
  Filled 2019-08-30: qty 15

## 2019-08-30 MED ORDER — POTASSIUM CHLORIDE CRYS ER 20 MEQ PO TBCR
40.0000 meq | EXTENDED_RELEASE_TABLET | Freq: Once | ORAL | Status: AC
  Administered 2019-08-30: 40 meq via ORAL
  Filled 2019-08-30: qty 2

## 2019-08-30 NOTE — Progress Notes (Signed)
PROGRESS NOTE    Nancy Hudson  TOI:[id number] DOB: 1995/12/23 DOA: 08/29/2019 PCP: Patient, No Pcp Per   Brief Narrative: Patient is a 24 year old female with history of asthma who presents to the emergency department with complaints of 5 days history of joint aches, rash, chills, malaise.  She also noted some pustules on her elbows, knees, hand, foot.  Developed chills, diffuse arthralgia, malaise at home.  She denied any abdomen pain or vaginal discharge.  On presentation she was febrile, tachycardic, blood pressure stable.  She had leukocytosis.  Patient was admitted for the management of possible sepsis secondary to disseminated gonorrhea.  Cultures have been sent.  Chlamydia/gonorrhea probe pending.  Case was discussed with ID, started on ceftriaxone and doxycycline.  Assessment & Plan:   Principal Problem:   Sepsis ([place]) Active Problems:   Asthma   Polyarthralgia   Hypokalemia   Suspected sepsis: Presented with generalized malaise, arthralgia, fever.  Cultures have been sent.  Continue current antibiotics for now.  Currently afebrile and hemodynamically stable.  Suspicion for disseminated gonorrhea: Presented with pustular tender lesions on bilateral knees, palm .Also had polyarthralgia, tenosynovitis like picture.  Chlamydia/gonorrhea probe pending.  Wet prep patient also showed clue cells.  She denied vaginal discharge.  Follow-up HIV/RPR. Currently on ceftriaxone, doxycycline.  Hypokalemia: Supplemented with potassium  Intermittent asthma: Stable.  Continue albuterol as needed.  Right shoulder pain: Complains of severe pain on the right shoulder.  Mildly edematous and tender.  Most likely acid with tenosynovitis but will check x-ray          DVT prophylaxis:SCD Code Status:Full  Family Communication: Discussed with the patient Status is: Observation  The patient remains OBS appropriate and will d/c before 2 midnights.  Dispo: The patient is from: Home  Anticipated d/c is to: Home              Anticipated d/c date is: 1 day              Patient currently is not medically stable to d/c.        Consultants: ID on phone,we will follow-up with ID tomorrow.  Procedures:None  Antimicrobials:  Anti-infectives (From admission, onward)   Start     Dose/Rate Route Frequency Ordered Stop   08/30/19 2000  cefTRIAXone (ROCEPHIN) 1 g in sodium chloride 0.9 % 100 mL IVPB     1 g 200 mL/hr over 30 Minutes Intravenous Every 24 hours 08/29/19 2123     08/29/19 2200  doxycycline (VIBRA-TABS) tablet 100 mg     100 mg Oral Every 12 hours 08/29/19 2123     08/29/19 2030  cefTRIAXone (ROCEPHIN) 1 g in sodium chloride 0.9 % 100 mL IVPB     1 g 200 mL/hr over 30 Minutes Intravenous  Once 08/29/19 2018 08/29/19 2119      Subjective:  Patient seen and examined at the bedside this morning.  Hemodynamically stable.  Feels little better today but complains of left shoulder pain.  Still has tender spots on bilateral knees and right palm.  Objective: Vitals:   08/29/19 2130 08/29/19 2243 08/30/19 0141 08/30/19 0609  BP: (!) 137/105 (!) 145/110 124/78 125/81  Pulse: 90 95 94 93  Resp: 15 20 18 16   Temp:  99.3 F (37.4 C) 98.7 F (37.1 C) 98 F (36.7 C)  TempSrc:  Oral Oral Oral  SpO2: 100% 100% 98% 96%  Height:        Intake/Output Summary (Last 24 hours) at 08/30/2019 0815 Last  data filed at 08/30/2019 0610 Gross per 24 hour  Intake 2858.47 ml  Output 300 ml  Net 2558.47 ml   There were no vitals filed for this visit.  Examination:  General exam: In mild discomfort due to right shoulder pain, obese  Respiratory system: Bilateral equal air entry, normal vesicular breath sounds, no wheezes or crackles  Cardiovascular system: S1 & S2 heard, RRR. No JVD, murmurs, rubs, gallops or clicks. No pedal edema. Gastrointestinal system: Abdomen is nondistended, soft and nontender. No organomegaly or masses felt. Normal bowel sounds heard. Central  nervous system: Alert and oriented. No focal neurological deficits. Extremities: No edema, no clubbing ,no cyanosis Skin:Postular lesions on bilateral knees, right palm    Data Reviewed: I have personally reviewed following labs and imaging studies  CBC: Recent Labs  Lab 08/29/19 1715 08/30/19 0516  WBC 10.7* 9.7  NEUTROABS 8.2* 6.3  HGB 13.5 12.5  HCT 40.1 37.5  MCV 90.1 91.2  PLT 216 218   Basic Metabolic Panel: Recent Labs  Lab 08/29/19 1715 08/30/19 0516  NA 137 136  K 3.2* 3.3*  CL 102 106  CO2 25 21*  GLUCOSE 84 72  BUN 9 9  CREATININE 0.63 0.51  CALCIUM 8.7* 8.0*  MG  --  2.0   GFR: CrCl cannot be calculated (Unknown ideal weight.). Liver Function Tests: Recent Labs  Lab 08/29/19 1715 08/30/19 0516  AST 41 28  ALT 64* 46*  ALKPHOS 89 71  BILITOT 0.8 0.7  PROT 7.6 6.2*  ALBUMIN 3.8 3.0*   No results for input(s): LIPASE, AMYLASE in the last 168 hours. No results for input(s): AMMONIA in the last 168 hours. Coagulation Profile: No results for input(s): INR, PROTIME in the last 168 hours. Cardiac Enzymes: Recent Labs  Lab 08/29/19 1715  CKTOTAL 38   BNP (last 3 results) No results for input(s): PROBNP in the last 8760 hours. HbA1C: No results for input(s): HGBA1C in the last 72 hours. CBG: No results for input(s): GLUCAP in the last 168 hours. Lipid Profile: No results for input(s): CHOL, HDL, LDLCALC, TRIG, CHOLHDL, LDLDIRECT in the last 72 hours. Thyroid Function Tests: No results for input(s): TSH, T4TOTAL, FREET4, T3FREE, THYROIDAB in the last 72 hours. Anemia Panel: No results for input(s): VITAMINB12, FOLATE, FERRITIN, TIBC, IRON, RETICCTPCT in the last 72 hours. Sepsis Labs: No results for input(s): PROCALCITON, LATICACIDVEN in the last 168 hours.  Recent Results (from the past 240 hour(s))  Wet prep, genital     Status: Abnormal   Collection Time: 08/29/19  5:08 PM   Specimen: PATH Cytology Cervicovaginal Ancillary Only  Result  Value Ref Range Status   Yeast Wet Prep HPF POC NONE SEEN NONE SEEN Final   Trich, Wet Prep NONE SEEN NONE SEEN Final   Clue Cells Wet Prep HPF POC PRESENT (A) NONE SEEN Final   WBC, Wet Prep HPF POC FEW (A) NONE SEEN Final   Sperm NONE SEEN  Final    Comment: Performed at [facility], [address] [place], [place] [address]  SARS CORONAVIRUS 2 (TAT 6-24 HRS) Nasopharyngeal Nasopharyngeal Swab     Status: None   Collection Time: 08/29/19  8:51 PM   Specimen: Nasopharyngeal Swab  Result Value Ref Range Status   SARS Coronavirus 2 NEGATIVE NEGATIVE Final    Comment: (NOTE) SARS-CoV-2 target nucleic acids are NOT DETECTED. The SARS-CoV-2 RNA is generally detectable in upper and lower respiratory specimens during the acute phase of infection. Negative results  do not preclude SARS-CoV-2 infection, do not rule out co-infections with other pathogens, and should not be used as the sole basis for treatment or other patient management decisions. Negative results must be combined with clinical observations, patient history, and epidemiological information. The expected result is Negative. Fact Sheet for Patients: HairSlick.no Fact Sheet for Healthcare Providers: quierodirigir.com This test is not yet approved or cleared by the [place] FDA and  has been authorized for detection and/or diagnosis of SARS-CoV-2 by FDA under an Emergency Use Authorization (EUA). This EUA will remain  in effect (meaning this test can be used) for the duration of the COVID-19 declaration under Section 56 4(b)(1) of the Act, 21 U.S.C. section 360bbb-3(b)(1), unless the authorization is terminated or revoked sooner. Performed at [facility], [address] [place], [place] [address]          Radiology Studies: No results found.      Scheduled Meds: . doxycycline  100 mg Oral Q12H  . potassium chloride  40 mEq Oral  Once   Continuous Infusions: . sodium chloride 90 mL/hr at 08/29/19 2314  . cefTRIAXone (ROCEPHIN)  IV       LOS: 0 days    Time spent:35 mins. More than 50% of that time was spent in counseling and/or coordination of care.      Burnadette Pop, MD [facility] P4/03/2020, 8:15 AM

## 2019-08-31 ENCOUNTER — Observation Stay (HOSPITAL_COMMUNITY): Payer: BLUE CROSS/BLUE SHIELD

## 2019-08-31 DIAGNOSIS — J4520 Mild intermittent asthma, uncomplicated: Secondary | ICD-10-CM | POA: Diagnosis present

## 2019-08-31 DIAGNOSIS — S46912A Strain of unspecified muscle, fascia and tendon at shoulder and upper arm level, left arm, initial encounter: Secondary | ICD-10-CM | POA: Diagnosis present

## 2019-08-31 DIAGNOSIS — A5489 Other gonococcal infections: Secondary | ICD-10-CM | POA: Diagnosis present

## 2019-08-31 DIAGNOSIS — X500XXA Overexertion from strenuous movement or load, initial encounter: Secondary | ICD-10-CM | POA: Diagnosis not present

## 2019-08-31 DIAGNOSIS — R609 Edema, unspecified: Secondary | ICD-10-CM

## 2019-08-31 DIAGNOSIS — M25462 Effusion, left knee: Secondary | ICD-10-CM | POA: Diagnosis not present

## 2019-08-31 DIAGNOSIS — A5409 Other gonococcal infection of lower genitourinary tract: Secondary | ICD-10-CM | POA: Diagnosis present

## 2019-08-31 DIAGNOSIS — A549 Gonococcal infection, unspecified: Secondary | ICD-10-CM | POA: Diagnosis not present

## 2019-08-31 DIAGNOSIS — Z20822 Contact with and (suspected) exposure to covid-19: Secondary | ICD-10-CM | POA: Diagnosis present

## 2019-08-31 DIAGNOSIS — A419 Sepsis, unspecified organism: Secondary | ICD-10-CM | POA: Diagnosis present

## 2019-08-31 DIAGNOSIS — Y93F2 Activity, caregiving, lifting: Secondary | ICD-10-CM | POA: Diagnosis not present

## 2019-08-31 DIAGNOSIS — E876 Hypokalemia: Secondary | ICD-10-CM | POA: Diagnosis present

## 2019-08-31 DIAGNOSIS — M2550 Pain in unspecified joint: Secondary | ICD-10-CM | POA: Diagnosis not present

## 2019-08-31 DIAGNOSIS — R262 Difficulty in walking, not elsewhere classified: Secondary | ICD-10-CM | POA: Diagnosis present

## 2019-08-31 DIAGNOSIS — M659 Synovitis and tenosynovitis, unspecified: Secondary | ICD-10-CM | POA: Diagnosis present

## 2019-08-31 LAB — CBC WITH DIFFERENTIAL/PLATELET
Abs Immature Granulocytes: 0.25 10*3/uL — ABNORMAL HIGH (ref 0.00–0.07)
Basophils Absolute: 0.1 10*3/uL (ref 0.0–0.1)
Basophils Relative: 1 %
Eosinophils Absolute: 0.1 10*3/uL (ref 0.0–0.5)
Eosinophils Relative: 1 %
HCT: 35.7 % — ABNORMAL LOW (ref 36.0–46.0)
Hemoglobin: 12.5 g/dL (ref 12.0–15.0)
Immature Granulocytes: 3 %
Lymphocytes Relative: 24 %
Lymphs Abs: 2.4 10*3/uL (ref 0.7–4.0)
MCH: 30.3 pg (ref 26.0–34.0)
MCHC: 35 g/dL (ref 30.0–36.0)
MCV: 86.4 fL (ref 80.0–100.0)
Monocytes Absolute: 0.8 10*3/uL (ref 0.1–1.0)
Monocytes Relative: 8 %
Neutro Abs: 6.4 10*3/uL (ref 1.7–7.7)
Neutrophils Relative %: 63 %
Platelets: 279 10*3/uL (ref 150–400)
RBC: 4.13 MIL/uL (ref 3.87–5.11)
RDW: 11.3 % — ABNORMAL LOW (ref 11.5–15.5)
WBC: 10.1 10*3/uL (ref 4.0–10.5)
nRBC: 0 % (ref 0.0–0.2)

## 2019-08-31 LAB — GC/CHLAMYDIA PROBE AMP (~~LOC~~) NOT AT ARMC
Chlamydia: NEGATIVE
Comment: NEGATIVE
Comment: NORMAL
Neisseria Gonorrhea: POSITIVE — AB

## 2019-08-31 LAB — BASIC METABOLIC PANEL
Anion gap: 10 (ref 5–15)
BUN: 9 mg/dL (ref 6–20)
CO2: 22 mmol/L (ref 22–32)
Calcium: 8.3 mg/dL — ABNORMAL LOW (ref 8.9–10.3)
Chloride: 106 mmol/L (ref 98–111)
Creatinine, Ser: 0.64 mg/dL (ref 0.44–1.00)
GFR calc Af Amer: >60 mL/min (ref >60)
GFR calc non Af Amer: >60 mL/min (ref >60)
Glucose, Bld: 81 mg/dL (ref 70–99)
Potassium: 4 mmol/L (ref 3.5–5.1)
Sodium: 138 mmol/L (ref 135–145)

## 2019-08-31 MED ORDER — MELOXICAM 15 MG PO TABS: 15.0000 mg | ORAL_TABLET | Freq: Every day | ORAL | 0 refills | Status: AC | PRN

## 2019-08-31 MED ORDER — MORPHINE SULFATE (PF) 4 MG/ML IV SOLN: 4.0000 mg | INTRAVENOUS | Status: DC | PRN

## 2019-08-31 MED ORDER — MORPHINE SULFATE (PF) 2 MG/ML IV SOLN: 2.0000 mg | INTRAVENOUS | Status: DC | PRN

## 2019-08-31 MED ORDER — DOXYCYCLINE HYCLATE 100 MG PO TBEC: 100.0000 mg | DELAYED_RELEASE_TABLET | Freq: Two times a day (BID) | ORAL | 0 refills | Status: DC

## 2019-08-31 NOTE — Progress Notes (Signed)
Upper venous duplex       has been completed. Preliminary results can be found under CV proc through chart review. Emerly Prak, BS, RDMS, RVT   

## 2019-08-31 NOTE — Progress Notes (Signed)
PROGRESS NOTE    Nancy Hudson  ONG:[id number] DOB: 02-Jun-1995 DOA: 08/29/2019 PCP: Patient, No Pcp Per   Brief Narrative: Patient is a 24 year old female with history of asthma who presents to the emergency department with complaints of 5 days history of joint aches, rash, chills, malaise.  She also noted some pustules on her elbows, knees, hand, foot.  Developed chills, diffuse arthralgia, malaise at home.  She denied any abdomen pain or vaginal discharge.  On presentation she was febrile, tachycardic, blood pressure stable.  She had leukocytosis.  Patient was admitted for the management of possible sepsis secondary to disseminated gonorrhea.  Cultures have been sent.  Chlamydia/gonorrhea probe pending.  Case was discussed with ID, started on ceftriaxone and doxycycline.  Assessment & Plan:   Principal Problem:   Sepsis (HCC) Active Problems:   Asthma   Polyarthralgia   Hypokalemia   Suspected sepsis: Presented with generalized malaise, arthralgia, fever.  Cultures have been sent.  Continue current antibiotics for now.  Currently afebrile and hemodynamically stable.  Suspicion for disseminated gonorrhea: Presented with pustular tender lesions on bilateral knees, palm .Also had polyarthralgia, tenosynovitis like picture.  Chlamydia/gonorrhea probe pending.  Wet prep patient also showed clue cells.  She denied vaginal discharge.   Currently on ceftriaxone, doxycycline.  Hypokalemia: Supplemented with potassium  Intermittent asthma: Stable.  Continue albuterol as needed.  Right shoulder pain/right upper extremity swelling: Complains of severe pain on the right shoulder. Edematous and tender.  No acute findings as per shoulder x-ray.  Venous Doppler negative for DVT.          DVT prophylaxis:SCD Code Status:Full  Family Communication: Discussed with the patient Status is: Observation  The patient remains OBS appropriate and will d/c before 2 midnights.  Dispo: The patient is  from: Home              Anticipated d/c is to: Home              Anticipated d/c date is: 1 day              Patient currently is not medically stable to d/c.        Consultants: ID.  Procedures:None  Antimicrobials:  Anti-infectives (From admission, onward)   Start     Dose/Rate Route Frequency Ordered Stop   08/30/19 2000  cefTRIAXone (ROCEPHIN) 1 g in sodium chloride 0.9 % 100 mL IVPB     1 g 200 mL/hr over 30 Minutes Intravenous Every 24 hours 08/29/19 2123     08/29/19 2200  doxycycline (VIBRA-TABS) tablet 100 mg     100 mg Oral Every 12 hours 08/29/19 2123     08/29/19 2030  cefTRIAXone (ROCEPHIN) 1 g in sodium chloride 0.9 % 100 mL IVPB     1 g 200 mL/hr over 30 Minutes Intravenous  Once 08/29/19 2018 08/29/19 2119      Subjective:  Patient seen and examined the bedside this morning.  Feels better today.  Afebrile.  Pain in the knees have resolved.  Complains of persistent right shoulder pain  Objective: Vitals:   08/30/19 1002 08/30/19 1335 08/30/19 2106 08/31/19 0551  BP: 132/89 (!) 134/101 (!) 130/105 114/87  Pulse: 92 86 94 89  Resp: 18 18 18 16   Temp: 98 F (36.7 C) (!) 97.4 F (36.3 C) 98.4 F (36.9 C) 98.5 F (36.9 C)  TempSrc: Oral Oral Oral Oral  SpO2: 94% 96% 100% 95%  Height:        Intake/Output  Summary (Last 24 hours) at 08/31/2019 1247 Last data filed at 08/31/2019 1000 Gross per 24 hour  Intake 520 ml  Output 2050 ml  Net -1530 ml   There were no vitals filed for this visit.  Examination:  General exam: Not in distress, morbidly obese Respiratory system: Bilateral equal air entry, normal vesicular breath sounds, no wheezes or crackles  Cardiovascular system: S1 & S2 heard, RRR. No JVD, murmurs, rubs, gallops or clicks. Gastrointestinal system: Abdomen is nondistended, soft and nontender. No organomegaly or masses felt. Normal bowel sounds heard. Central nervous system: Alert and oriented. No focal neurological deficits. Extremities:  No edema, no clubbing ,no cyanosis, edematous right upper extremity, tenderness on the right shoulder.   Skin: Small pustules on bilateral knees, right arm   Data Reviewed: I have personally reviewed following labs and imaging studies  CBC: Recent Labs  Lab 08/29/19 1715 08/30/19 0516 08/31/19 0455  WBC 10.7* 9.7 10.1  NEUTROABS 8.2* 6.3 6.4  HGB 13.5 12.5 12.5  HCT 40.1 37.5 35.7*  MCV 90.1 91.2 86.4  PLT 216 218 279   Basic Metabolic Panel: Recent Labs  Lab 08/29/19 1715 08/30/19 0516 08/31/19 0455  NA 137 136 138  K 3.2* 3.3* 4.0  CL 102 106 106  CO2 25 21* 22  GLUCOSE 84 72 81  BUN 9 9 9   CREATININE 0.63 0.51 0.64  CALCIUM 8.7* 8.0* 8.3*  MG  --  2.0  --    GFR: CrCl cannot be calculated (Unknown ideal weight.). Liver Function Tests: Recent Labs  Lab 08/29/19 1715 08/30/19 0516  AST 41 28  ALT 64* 46*  ALKPHOS 89 71  BILITOT 0.8 0.7  PROT 7.6 6.2*  ALBUMIN 3.8 3.0*   No results for input(s): LIPASE, AMYLASE in the last 168 hours. No results for input(s): AMMONIA in the last 168 hours. Coagulation Profile: No results for input(s): INR, PROTIME in the last 168 hours. Cardiac Enzymes: Recent Labs  Lab 08/29/19 1715  CKTOTAL 38   BNP (last 3 results) No results for input(s): PROBNP in the last 8760 hours. HbA1C: No results for input(s): HGBA1C in the last 72 hours. CBG: No results for input(s): GLUCAP in the last 168 hours. Lipid Profile: No results for input(s): CHOL, HDL, LDLCALC, TRIG, CHOLHDL, LDLDIRECT in the last 72 hours. Thyroid Function Tests: No results for input(s): TSH, T4TOTAL, FREET4, T3FREE, THYROIDAB in the last 72 hours. Anemia Panel: No results for input(s): VITAMINB12, FOLATE, FERRITIN, TIBC, IRON, RETICCTPCT in the last 72 hours. Sepsis Labs: No results for input(s): PROCALCITON, LATICACIDVEN in the last 168 hours.  Recent Results (from the past 240 hour(s))  Wet prep, genital     Status: Abnormal   Collection Time:  08/29/19  5:08 PM   Specimen: PATH Cytology Cervicovaginal Ancillary Only  Result Value Ref Range Status   Yeast Wet Prep HPF POC NONE SEEN NONE SEEN Final   Trich, Wet Prep NONE SEEN NONE SEEN Final   Clue Cells Wet Prep HPF POC PRESENT (A) NONE SEEN Final   WBC, Wet Prep HPF POC FEW (A) NONE SEEN Final   Sperm NONE SEEN  Final    Comment: Performed at [facility], [address] [place], [place]  Blood culture (routine x 2)     Status: None (Preliminary result)   Collection Time: 08/29/19  5:15 PM   Specimen: BLOOD LEFT ARM  Result Value Ref Range Status   Specimen Description BLOOD LEFT ARM  Final  Special Requests   Final    BOTTLES DRAWN AEROBIC AND ANAEROBIC Blood Culture adequate volume   Culture   Final    NO GROWTH 2 DAYS Performed at [facility], [address], [place], [place] [address]    Report Status PENDING  Incomplete  Blood culture (routine x 2)     Status: None (Preliminary result)   Collection Time: 08/29/19  6:00 PM   Specimen: BLOOD  Result Value Ref Range Status   Specimen Description BLOOD RIGHT ANTECUBITAL  Final   Special Requests   Final    BOTTLES DRAWN AEROBIC AND ANAEROBIC Blood Culture results may not be optimal due to an inadequate volume of blood received in culture bottles   Culture   Final    NO GROWTH 2 DAYS Performed at [facility], [address] [place], [place] [address]    Report Status PENDING  Incomplete  SARS CORONAVIRUS 2 (TAT 6-24 HRS) Nasopharyngeal Nasopharyngeal Swab     Status: None   Collection Time: 08/29/19  8:51 PM   Specimen: Nasopharyngeal Swab  Result Value Ref Range Status   SARS Coronavirus 2 NEGATIVE NEGATIVE Final    Comment: (NOTE) SARS-CoV-2 target nucleic acids are NOT DETECTED. The SARS-CoV-2 RNA is generally detectable in upper and lower respiratory specimens during the acute phase of infection. Negative results do not preclude SARS-CoV-2 infection, do not rule  out co-infections with other pathogens, and should not be used as the sole basis for treatment or other patient management decisions. Negative results must be combined with clinical observations, patient history, and epidemiological information. The expected result is Negative. Fact Sheet for Patients: HairSlick.no Fact Sheet for Healthcare Providers: quierodirigir.com This test is not yet approved or cleared by the [place] FDA and  has been authorized for detection and/or diagnosis of SARS-CoV-2 by FDA under an Emergency Use Authorization (EUA). This EUA will remain  in effect (meaning this test can be used) for the duration of the COVID-19 declaration under Section 56 4(b)(1) of the Act, 21 U.S.C. section 360bbb-3(b)(1), unless the authorization is terminated or revoked sooner. Performed at [facility], [address], [place], [place] [address]          Radiology Studies: DG Shoulder Right  Result Date: 08/30/2019 CLINICAL DATA:  24 year old female with right shoulder pain. EXAM: RIGHT SHOULDER - 2+ VIEW COMPARISON:  None. FINDINGS: There is no evidence of fracture or dislocation. There is no evidence of arthropathy or other focal bone abnormality. Soft tissues are unremarkable. IMPRESSION: Negative. Electronically Signed   By: Elgie Collard M.D.   On: 08/30/2019 16:23   VAS [place] UPPER EXTREMITY VENOUS DUPLEX  Result Date: 08/31/2019 UPPER VENOUS STUDY  Indications: Edema, and Pain Comparison Study: no prior Performing Technologist: Jeb Levering RDMS, RVT  Examination Guidelines: A complete evaluation includes B-mode imaging, spectral Doppler, color Doppler, and power Doppler as needed of all accessible portions of each vessel. Bilateral testing is considered an integral part of a complete examination. Limited examinations for reoccurring indications may be performed as noted.  Right Findings:  +----------+------------+---------+-----------+----------+-------+ RIGHT     CompressiblePhasicitySpontaneousPropertiesSummary +----------+------------+---------+-----------+----------+-------+ IJV           Full       Yes       Yes                      +----------+------------+---------+-----------+----------+-------+ Subclavian    Full  Yes       Yes                      +----------+------------+---------+-----------+----------+-------+ Axillary      Full       Yes       Yes                      +----------+------------+---------+-----------+----------+-------+ Brachial      Full       Yes       Yes                      +----------+------------+---------+-----------+----------+-------+ Radial        Full                                          +----------+------------+---------+-----------+----------+-------+ Ulnar         Full                                          +----------+------------+---------+-----------+----------+-------+ Cephalic      Full                                          +----------+------------+---------+-----------+----------+-------+ Basilic       Full                                          +----------+------------+---------+-----------+----------+-------+  Left Findings: +----+------------+---------+-----------+----------+--------------+ LEFTCompressiblePhasicitySpontaneousProperties   Summary     +----+------------+---------+-----------+----------+--------------+ IJV                                           Not visualized +----+------------+---------+-----------+----------+--------------+  Summary:  Right: No evidence of deep vein thrombosis in the upper extremity. No evidence of superficial vein thrombosis in the upper extremity.  *See table(s) above for measurements and observations.    Preliminary         Scheduled Meds: . doxycycline  100 mg Oral Q12H   Continuous Infusions: . cefTRIAXone  (ROCEPHIN)  IV 1 g (08/30/19 1956)     LOS: 0 days    Time spent:35 mins. More than 50% of that time was spent in counseling and/or coordination of care.      Shelly Coss, MD [facility] P4/04/2020, 12:47 PM

## 2019-08-31 NOTE — Discharge Summary (Signed)
Physician Discharge Summary  Nancy Hudson WUJ:[id number] DOB: [birth year] DOA: 08/29/2019  PCP: Patient, No Pcp Per  Admit date: 08/29/2019 Discharge date: 08/31/2019  Admitted From: Home Disposition:  Home  Discharge Condition:Stable CODE STATUS:FULL Diet recommendation: Regular   Brief/Interim Summary:  Patient is a 24 year old female with history of asthma who presents to the emergency department with complaints of 5 days history of joint aches, rash, chills, malaise.  She also noted some pustules on her elbows, knees, hand, foot.  Developed chills, diffuse arthralgia, malaise at home.  She denied any abdomen pain or vaginal discharge.  On presentation she was febrile, tachycardic, blood pressure stable.  She had leukocytosis.  Patient was admitted for the management of possible sepsis secondary to disseminated gonorrhea.  Cultures have been sent.  Chlamydia/gonorrhea probe pending.  Case was discussed with ID, started on ceftriaxone and doxycycline.  She remains hemodynamically stable.  ID cleared her for discharge with 7 days of oral doxycycline.  Following problems were addressed during her hospitalization:  Suspected sepsis: Ruled out. Presented with generalized malaise, arthralgia, fever.  Cultures have been sent.NGTD .  Currently afebrile and hemodynamically stable.  Disseminated gonorrhea: Presented with pustular tender lesions on bilateral knees, palm .Also had polyarthralgia, tenosynovitis like picture.  Chlamydia/gonorrhea probe positive for gonorrhea.Wet prep patient also showed clue cells.  She denied vaginal discharge.   She was  on ceftriaxone, doxycycline.  ID was consulted.  Recommended discharge with 7 days course of doxycycline.  She might follow-up with infectious disease as an outpatient  Hypokalemia: Supplemented with potassium  Intermittent asthma: Stable.  Continue albuterol as needed.  Right shoulder pain/right upper extremity swelling: Complained of severe  pain on the right shoulder. Edematous and tender.  No acute findings as per shoulder x-ray.  Venous Doppler negative for DVT.   Discharge Diagnoses:  Principal Problem:   Sepsis (HCC) Active Problems:   Asthma   Polyarthralgia   Hypokalemia    Discharge Instructions  Discharge Instructions    Diet general   Complete by: As directed    Discharge instructions   Complete by: As directed    1)Please take prescribed medications as instructed.   Increase activity slowly   Complete by: As directed      Allergies as of 08/31/2019   No Known Allergies     Medication List    STOP taking these medications   diclofenac 50 MG tablet Commonly known as: CATAFLAM     TAKE these medications   acetaminophen 500 MG tablet Commonly known as: TYLENOL Take 500 mg by mouth every 6 (six) hours as needed for mild pain or moderate pain.   BENADRYL ALLERGY PO Take 1 Dose by mouth every 4 (four) hours as needed (itching, allergic reaction).   cetirizine 10 MG tablet Commonly known as: ZYRTEC Take 10 mg by mouth daily as needed for allergies.   cyclobenzaprine 10 MG tablet Commonly known as: FLEXERIL Take 10 mg by mouth 3 (three) times daily as needed for muscle spasms.   doxycycline 100 MG EC tablet Commonly known as: DORYX Take 1 tablet (100 mg total) by mouth 2 (two) times daily.   fluticasone 50 MCG/ACT nasal spray Commonly known as: FLONASE Place 1-2 sprays into both nostrils daily as needed for allergies.   meloxicam 15 MG tablet Commonly known as: MOBIC Take 1 tablet (15 mg total) by mouth daily as needed for pain.       No Known Allergies  Consultations:  ID   Procedures/Studies: DG  Shoulder Right  Result Date: 08/30/2019 CLINICAL DATA:  24 year old female with right shoulder pain. EXAM: RIGHT SHOULDER - 2+ VIEW COMPARISON:  None. FINDINGS: There is no evidence of fracture or dislocation. There is no evidence of arthropathy or other focal bone abnormality. Soft  tissues are unremarkable. IMPRESSION: Negative. Electronically Signed   By: Elgie Collard M.D.   On: 08/30/2019 16:23   VAS [place] UPPER EXTREMITY VENOUS DUPLEX  Result Date: 08/31/2019 UPPER VENOUS STUDY  Indications: Edema, and Pain Comparison Study: no prior Performing Technologist: Jeb Levering RDMS, RVT  Examination Guidelines: A complete evaluation includes B-mode imaging, spectral Doppler, color Doppler, and power Doppler as needed of all accessible portions of each vessel. Bilateral testing is considered an integral part of a complete examination. Limited examinations for reoccurring indications may be performed as noted.  Right Findings: +----------+------------+---------+-----------+----------+-------+ RIGHT     CompressiblePhasicitySpontaneousPropertiesSummary +----------+------------+---------+-----------+----------+-------+ IJV           Full       Yes       Yes                      +----------+------------+---------+-----------+----------+-------+ Subclavian    Full       Yes       Yes                      +----------+------------+---------+-----------+----------+-------+ Axillary      Full       Yes       Yes                      +----------+------------+---------+-----------+----------+-------+ Brachial      Full       Yes       Yes                      +----------+------------+---------+-----------+----------+-------+ Radial        Full                                          +----------+------------+---------+-----------+----------+-------+ Ulnar         Full                                          +----------+------------+---------+-----------+----------+-------+ Cephalic      Full                                          +----------+------------+---------+-----------+----------+-------+ Basilic       Full                                          +----------+------------+---------+-----------+----------+-------+  Left Findings:  +----+------------+---------+-----------+----------+--------------+ LEFTCompressiblePhasicitySpontaneousProperties   Summary     +----+------------+---------+-----------+----------+--------------+ IJV                                           Not visualized +----+------------+---------+-----------+----------+--------------+  Summary:  Right: No evidence of deep vein thrombosis in  the upper extremity. No evidence of superficial vein thrombosis in the upper extremity.  *See table(s) above for measurements and observations.    Preliminary        Subjective: Patient seen and examined at the bedside this morning.  hemodynamically stable for discharge today.  Discharge Exam: Vitals:   08/31/19 0551 08/31/19 1359  BP: 114/87 (!) 132/97  Pulse: 89 93  Resp: 16 14  Temp: 98.5 F (36.9 C) 98.3 F (36.8 C)  SpO2: 95% 100%   Vitals:   08/30/19 1335 08/30/19 2106 08/31/19 0551 08/31/19 1359  BP: (!) 134/101 (!) 130/105 114/87 (!) 132/97  Pulse: 86 94 89 93  Resp: 18 18 16 14   Temp: (!) 97.4 F (36.3 C) 98.4 F (36.9 C) 98.5 F (36.9 C) 98.3 F (36.8 C)  TempSrc: Oral Oral Oral Oral  SpO2: 96% 100% 95% 100%  Height:        General: Pt is alert, awake, not in acute distress Cardiovascular: RRR, S1/S2 +, no rubs, no gallops Respiratory: CTA bilaterally, no wheezing, no rhonchi Abdominal: Soft, NT, ND, bowel sounds + Extremities: no edema, no cyanosis    The results of significant diagnostics from this hospitalization (including imaging, microbiology, ancillary and laboratory) are listed below for reference.     Microbiology: Recent Results (from the past 240 hour(s))  Wet prep, genital     Status: Abnormal   Collection Time: 08/29/19  5:08 PM   Specimen: PATH Cytology Cervicovaginal Ancillary Only  Result Value Ref Range Status   Yeast Wet Prep HPF POC NONE SEEN NONE SEEN Final   Trich, Wet Prep NONE SEEN NONE SEEN Final   Clue Cells Wet Prep HPF POC PRESENT (A)  NONE SEEN Final   WBC, Wet Prep HPF POC FEW (A) NONE SEEN Final   Sperm NONE SEEN  Final    Comment: Performed at [facility], [address], [place], [place]  Blood culture (routine x 2)     Status: None (Preliminary result)   Collection Time: 08/29/19  5:15 PM   Specimen: BLOOD LEFT ARM  Result Value Ref Range Status   Specimen Description BLOOD LEFT ARM  Final   Special Requests   Final    BOTTLES DRAWN AEROBIC AND ANAEROBIC Blood Culture adequate volume   Culture   Final    NO GROWTH 2 DAYS Performed at [facility], [address] [place], [place]    Report Status PENDING  Incomplete  Blood culture (routine x 2)     Status: None (Preliminary result)   Collection Time: 08/29/19  6:00 PM   Specimen: BLOOD  Result Value Ref Range Status   Specimen Description BLOOD RIGHT ANTECUBITAL  Final   Special Requests   Final    BOTTLES DRAWN AEROBIC AND ANAEROBIC Blood Culture results may not be optimal due to an inadequate volume of blood received in culture bottles   Culture   Final    NO GROWTH 2 DAYS Performed at [facility], [address], [place], [place]    Report Status PENDING  Incomplete  SARS CORONAVIRUS 2 (TAT 6-24 HRS) Nasopharyngeal Nasopharyngeal Swab     Status: None   Collection Time: 08/29/19  8:51 PM   Specimen: Nasopharyngeal Swab  Result Value Ref Range Status   SARS Coronavirus 2 NEGATIVE NEGATIVE Final    Comment: (NOTE) SARS-CoV-2 target nucleic acids are NOT DETECTED. The SARS-CoV-2 RNA is generally detectable in upper  and lower respiratory specimens during the acute phase of infection. Negative results do not preclude SARS-CoV-2 infection, do not rule out co-infections with other pathogens, and should not be used as the sole basis for treatment or other patient management decisions. Negative results must be combined with clinical observations, patient history, and epidemiological information.  The expected result is Negative. Fact Sheet for Patients: HairSlick.nohttps://www.fda.gov/media/138098/download Fact Sheet for Healthcare Providers: quierodirigir.comhttps://www.fda.gov/media/138095/download This test is not yet approved or cleared by the [place] FDA and  has been authorized for detection and/or diagnosis of SARS-CoV-2 by FDA under an Emergency Use Authorization (EUA). This EUA will remain  in effect (meaning this test can be used) for the duration of the COVID-19 declaration under Section 56 4(b)(1) of the Act, 21 U.S.C. section 360bbb-3(b)(1), unless the authorization is terminated or revoked sooner. Performed at [facility], [address] [place], [place] [address]      Labs: BNP (last 3 results) No results for input(s): BNP in the last 8760 hours. Basic Metabolic Panel: Recent Labs  Lab 08/29/19 1715 08/30/19 0516 08/31/19 0455  NA 137 136 138  K 3.2* 3.3* 4.0  CL 102 106 106  CO2 25 21* 22  GLUCOSE 84 72 81  BUN 9 9 9   CREATININE 0.63 0.51 0.64  CALCIUM 8.7* 8.0* 8.3*  MG  --  2.0  --    Liver Function Tests: Recent Labs  Lab 08/29/19 1715 08/30/19 0516  AST 41 28  ALT 64* 46*  ALKPHOS 89 71  BILITOT 0.8 0.7  PROT 7.6 6.2*  ALBUMIN 3.8 3.0*   No results for input(s): LIPASE, AMYLASE in the last 168 hours. No results for input(s): AMMONIA in the last 168 hours. CBC: Recent Labs  Lab 08/29/19 1715 08/30/19 0516 08/31/19 0455  WBC 10.7* 9.7 10.1  NEUTROABS 8.2* 6.3 6.4  HGB 13.5 12.5 12.5  HCT 40.1 37.5 35.7*  MCV 90.1 91.2 86.4  PLT 216 218 279   Cardiac Enzymes: Recent Labs  Lab 08/29/19 1715  CKTOTAL 38   BNP: Invalid input(s): POCBNP CBG: No results for input(s): GLUCAP in the last 168 hours. D-Dimer No results for input(s): DDIMER in the last 72 hours. Hgb A1c No results for input(s): HGBA1C in the last 72 hours. Lipid Profile No results for input(s): CHOL, HDL, LDLCALC, TRIG, CHOLHDL, LDLDIRECT in the last 72 hours. Thyroid  function studies No results for input(s): TSH, T4TOTAL, T3FREE, THYROIDAB in the last 72 hours.  Invalid input(s): FREET3 Anemia work up No results for input(s): VITAMINB12, FOLATE, FERRITIN, TIBC, IRON, RETICCTPCT in the last 72 hours. Urinalysis    Component Value Date/Time   COLORURINE YELLOW 08/29/2019 1826   APPEARANCEUR HAZY (A) 08/29/2019 1826   LABSPEC 1.020 08/29/2019 1826   PHURINE 6.5 08/29/2019 1826   GLUCOSEU NEGATIVE 08/29/2019 1826   HGBUR NEGATIVE 08/29/2019 1826   BILIRUBINUR SMALL (A) 08/29/2019 1826   KETONESUR >80 (A) 08/29/2019 1826   PROTEINUR TRACE (A) 08/29/2019 1826   NITRITE NEGATIVE 08/29/2019 1826   LEUKOCYTESUR SMALL (A) 08/29/2019 1826   Sepsis Labs Invalid input(s): PROCALCITONIN,  WBC,  LACTICIDVEN Microbiology Recent Results (from the past 240 hour(s))  Wet prep, genital     Status: Abnormal   Collection Time: 08/29/19  5:08 PM   Specimen: PATH Cytology Cervicovaginal Ancillary Only  Result Value Ref Range Status   Yeast Wet Prep HPF POC NONE SEEN NONE SEEN Final   Trich, Wet Prep NONE SEEN NONE SEEN Final  Clue Cells Wet Prep HPF POC PRESENT (A) NONE SEEN Final   WBC, Wet Prep HPF POC FEW (A) NONE SEEN Final   Sperm NONE SEEN  Final    Comment: Performed at [facility], [place] [address], [place], [place] [address]  Blood culture (routine x 2)     Status: None (Preliminary result)   Collection Time: 08/29/19  5:15 PM   Specimen: BLOOD LEFT ARM  Result Value Ref Range Status   Specimen Description BLOOD LEFT ARM  Final   Special Requests   Final    BOTTLES DRAWN AEROBIC AND ANAEROBIC Blood Culture adequate volume   Culture   Final    NO GROWTH 2 DAYS Performed at [facility], [place] [address] [place], [place] [address]    Report Status PENDING  Incomplete  Blood culture (routine x 2)     Status: None (Preliminary result)   Collection Time: 08/29/19  6:00 PM   Specimen: BLOOD  Result Value Ref Range Status    Specimen Description BLOOD RIGHT ANTECUBITAL  Final   Special Requests   Final    BOTTLES DRAWN AEROBIC AND ANAEROBIC Blood Culture results may not be optimal due to an inadequate volume of blood received in culture bottles   Culture   Final    NO GROWTH 2 DAYS Performed at [facility], [place] [address] [place], [place] [address]    Report Status PENDING  Incomplete  SARS CORONAVIRUS 2 (TAT 6-24 HRS) Nasopharyngeal Nasopharyngeal Swab     Status: None   Collection Time: 08/29/19  8:51 PM   Specimen: Nasopharyngeal Swab  Result Value Ref Range Status   SARS Coronavirus 2 NEGATIVE NEGATIVE Final    Comment: (NOTE) SARS-CoV-2 target nucleic acids are NOT DETECTED. The SARS-CoV-2 RNA is generally detectable in upper and lower respiratory specimens during the acute phase of infection. Negative results do not preclude SARS-CoV-2 infection, do not rule out co-infections with other pathogens, and should not be used as the sole basis for treatment or other patient management decisions. Negative results must be combined with clinical observations, patient history, and epidemiological information. The expected result is Negative. Fact Sheet for Patients: SugarRoll.be Fact Sheet for Healthcare Providers: https://www.woods-mathews.com/ This test is not yet approved or cleared by the [place] FDA and  has been authorized for detection and/or diagnosis of SARS-CoV-2 by FDA under an Emergency Use Authorization (EUA). This EUA will remain  in effect (meaning this test can be used) for the duration of the COVID-19 declaration under Section 56 4(b)(1) of the Act, 21 U.S.C. section 360bbb-3(b)(1), unless the authorization is terminated or revoked sooner. Performed at [facility], [place] [address] [place], [place] [address]     Please note: You were cared for by a hospitalist during your hospital stay. Once you are discharged, your primary  care physician will handle any further medical issues. Please note that NO REFILLS for any discharge medications will be authorized once you are discharged, as it is imperative that you return to your primary care physician (or establish a relationship with a primary care physician if you do not have one) for your post hospital discharge needs so that they can reassess your need for medications and monitor your lab values.    Time coordinating discharge: 40 minutes  SIGNED:   Shelly Coss, MD  [facility] 08/31/2019, 3:08 PM Pager [phone]  If 7PM-7AM, please contact night-coverage [email or web address] Password [email or web address]

## 2019-08-31 NOTE — Evaluation (Signed)
Physical Therapy Evaluation Patient Details Name: Quinlan Mcfall MRN: [id number] DOB: Sep 28, 1995 Today's Date: 08/31/2019   History of Present Illness  Mehrer is a 24 y.o. female with medical history significant for asthma and recent right shoulder strain. Patient presented to emergency department with approximately 5 days of joint aches, rash, chills, and malaise. Also noted small pustules appear over her elbow, knee, hand, and foot. Patient admitted with concern for sepsis with poyarthralgia. Patient positive for disseminated gonococcus.    Clinical Impression  Delene Morais is 24 y.o. female admitted with above HPI and diagnosis. Patient is currently limited by functional impairments below (see PT problem list). Patient lives with her mother in [place], [place] and has a second floor apartment with no elevated. Patient has been visiting her cousin in [place] and has 3 flights of stairs to ascend to get to apartment here. At baseline patient is fully independent with mobility and ADL's. Patient was agreeable to participate in therapy and is motivated to return to her normal independence. She was extremely limited by severe joint pain and was noted to have moderate to edema throughout bi UE's/LE's. Patient is not safe to discharge at this time and would require 24/7 assist with therapy at SNF level currently. Patient will benefit from continued skilled PT interventions to address impairments and progress independence with mobility. Acute PT will follow and progress as able. Pain/edema/inflammation management will be essential to patient progressing with acute therapy services. Patient will also benefit from skilled OT eval as she is unable to perform any self care and limited with UE functional mobility.    Follow Up Recommendations SNF;Supervision/Assistance - 24 hour    Equipment Recommendations  Other (comment)(TBD pending progress. At this time pt would need RW, BSC, WC)    Recommendations for  Other Services OT consult     Precautions / Restrictions Precautions Precautions: Fall Restrictions Weight Bearing Restrictions: No      Mobility  Bed Mobility Overal bed mobility: Needs Assistance Bed Mobility: Supine to Sit;Sit to Supine     Supine to sit: Mod assist;HOB elevated Sit to supine: Mod assist;+2 for physical assistance;HOB elevated   General bed mobility comments: Patient using abdominals to sit up in long sit position with significant effort. Pt unable to use Rt hand on bed rails due to pain. Lt hand able to assist with use of rail and pressing on bed to scoto towards EOB. Moderate assist required to bring Lt LE to EOB, and max assist needed to fully pivot with use of chuck pad to get feet on floor.  Transfers Overall transfer level: Needs assistance Equipment used: Rolling walker (2 wheeled) Transfers: Sit to/from Stand Sit to Stand: Mod assist;From elevated surface;Max assist         General transfer comment: Patient required elevated surface to initaite power up and ceus for safe hand placement on RW needed. Pt unable to weight bear on RW using forearm on walker. Pt put forth significant effort and in obvious pain in bil UE's and LE's specifically Lt knee and Rt foot. Pt unable to fully straighten posture and moderate assist provided to return to sitting EOB.  Ambulation/Gait             General Gait Details: not tested due to pain and safety concerns  Stairs            Wheelchair Mobility    Modified Rankin (Stroke Patients Only)       Balance Overall balance assessment: Needs assistance Sitting-balance  support: Feet supported;Single extremity supported Sitting balance-Leahy Scale: Fair     Standing balance support: During functional activity;Bilateral upper extremity supported Standing balance-Leahy Scale: Poor              Pertinent Vitals/Pain Pain Assessment: Faces Faces Pain Scale: Hurts worst Pain Location: Rt hand and  shoulder, Left hand and fingers, Lt knee and foot, Rt foot. Pain Descriptors / Indicators: Aching;Grimacing;Guarding;Moaning Pain Intervention(s): Limited activity within patient's tolerance;Monitored during session;Repositioned    Home Living Family/patient expects to be discharged to:: Private residence Living Arrangements: Parent Available Help at Discharge: Family Type of Home: Apartment Home Access: Stairs to enter Entrance Stairs-Rails: Left;Right;Can reach both(can reach both at home, can reach 1 at cousins) Technical brewer of Steps: 1 flight at home in [place], [place]. 3 flights at [employer] in [place]. Home Layout: One level Home Equipment: None      Prior Function Level of Independence: Independent         Comments: pt has been fully independent with ADL's, mobility, and was driving and job searching in [place] where she moved with her mother in February.      Hand Dominance   Dominant Hand: Right    Extremity/Trunk Assessment   Upper Extremity Assessment Upper Extremity Assessment: Defer to OT evaluation;RUE deficits/detail;LUE deficits/detail RUE Deficits / Details: pt able to complete combined shoulder motion for flexion/abduction/external rotation to touch the top of her head. Pt with noted edema present in entire Rt UE, greatest distally.  RUE: Unable to fully assess due to pain LUE Deficits / Details: pt unable to complete combined motion of shoulder to touch her head. some edema present in Lt hand. LUE: Unable to fully assess due to pain    Lower Extremity Assessment Lower Extremity Assessment: RLE deficits/detail;LLE deficits/detail RLE Deficits / Details: pt with pustules on Rt knee and minimal edema noted in Rt knee. Rt lateral foot tender and edematous.  RLE: Unable to fully assess due to pain LLE Deficits / Details: Pt with pustule on Lt knee and anterior lower leg. Moderate edema noted at Lt knee and in lower leg and foot. pt highly sensitive and  tender to touch. LLE: Unable to fully assess due to pain    Cervical / Trunk Assessment Cervical / Trunk Assessment: Other exceptions;Normal Cervical / Trunk Exceptions: larger body habitus  Communication   Communication: No difficulties  Cognition Arousal/Alertness: Awake/alert Behavior During Therapy: WFL for tasks assessed/performed Overall Cognitive Status: Within Functional Limits for tasks assessed          General Comments: pt agreeable to work with therapy and motivated to mobilize however greatly limited by pain.       General Comments General comments (skin integrity, edema, etc.): pt with mild to moderate edema and errythema noted at multiple joints (Rt hand, shoulder, knee, foot; Lt hand, knee, ankle, and foot) pt joints extremelly tender to touch.    Exercises Hand Exercises Digit Composite Flexion: Other (comment);Right Composite Flexion Limitations: educated on however pt unable to complete for Rt hand Opposition: AROM;Right Opposition Limitations: educated on however pt unable to complete for Rt hand   Assessment/Plan    PT Assessment Patient needs continued PT services  PT Problem List Decreased range of motion;Decreased strength;Decreased activity tolerance;Decreased balance;Decreased mobility;Decreased knowledge of use of DME;Obesity;Pain       PT Treatment Interventions DME instruction;Gait training;Stair training;Functional mobility training;Therapeutic activities;Therapeutic exercise;Balance training;Patient/family education    PT Goals (Current goals can be found in the Care Plan section)  Acute Rehab PT Goals Patient Stated Goal: to get back to her normal independence PT Goal Formulation: With patient/family Time For Goal Achievement: 09/14/19 Potential to Achieve Goals: Good    Frequency Min 4X/week   Barriers to discharge Inaccessible home environment;Decreased caregiver support Pt lives in [place] with her mother who works and is working  from home only a couple days/week. In [place] pt has 1 flight of stairs to ascend to her second floor apartment. In [place] pt is visiting family and has 3 flights of stairs to enter the apartment.       AM-PAC PT "6 Clicks" Mobility  Outcome Measure Help needed turning from your back to your side while in a flat bed without using bedrails?: A Lot Help needed moving from lying on your back to sitting on the side of a flat bed without using bedrails?: A Lot Help needed moving to and from a bed to a chair (including a wheelchair)?: Total Help needed standing up from a chair using your arms (e.g., wheelchair or bedside chair)?: A Lot Help needed to walk in hospital room?: Total Help needed climbing 3-5 steps with a railing? : Total 6 Click Score: 9    End of Session Equipment Utilized During Treatment: Gait belt Activity Tolerance: Patient limited by pain Patient left: in bed;with call bell/phone within reach;with bed alarm set;with family/visitor present Nurse Communication: Mobility status;Other (comment)(significant edema/errythema at multiple joints) PT Visit Diagnosis: Difficulty in walking, not elsewhere classified (R26.2);Pain;Other abnormalities of gait and mobility (R26.89) Pain - Right/Left: (Bilateral) Pain - part of body: Hand;Shoulder;Arm;Ankle and joints of foot;Knee    Time: 2426-8341 PT Time Calculation (min) (ACUTE ONLY): 38 min   Charges:   PT Evaluation $PT Eval Low Complexity: 1 Low PT Treatments $Therapeutic Activity: 23-37 mins        Wynn Maudlin, DPT Physical Therapist with [facility] [phone]  08/31/2019 7:24 PM

## 2019-09-01 DIAGNOSIS — A419 Sepsis, unspecified organism: Secondary | ICD-10-CM | POA: Diagnosis not present

## 2019-09-01 MED ORDER — PANTOPRAZOLE SODIUM 40 MG PO TBEC
40.0000 mg | DELAYED_RELEASE_TABLET | Freq: Every day | ORAL | Status: DC
  Administered 2019-09-01 – 2019-09-04 (×4): 40 mg via ORAL
  Filled 2019-09-01 (×4): qty 1

## 2019-09-01 MED ORDER — IBUPROFEN 400 MG PO TABS
600.0000 mg | ORAL_TABLET | Freq: Four times a day (QID) | ORAL | Status: DC
  Administered 2019-09-01 – 2019-09-04 (×12): 600 mg via ORAL
  Filled 2019-09-01 (×15): qty 1

## 2019-09-01 MED ORDER — MORPHINE SULFATE (PF) 2 MG/ML IV SOLN
2.0000 mg | INTRAVENOUS | Status: DC | PRN
  Administered 2019-09-01 – 2019-09-02 (×3): 2 mg via INTRAVENOUS
  Filled 2019-09-01 (×3): qty 1

## 2019-09-01 NOTE — Progress Notes (Signed)
PROGRESS NOTE    Nancy Hudson  VFI:[id number] DOB: 1995/12/01 DOA: 08/29/2019 PCP: Patient, No Pcp Per   Brief Narrative: Patient is a 24 year old female with history of asthma who presents to the emergency department with complaints of 5 days history of joint aches, rash, chills, malaise.  She also noted some pustules on her elbows, knees, hand, foot.  Developed chills, diffuse arthralgia, malaise at home.  She denied any abdomen pain or vaginal discharge.  On presentation she was febrile, tachycardic, blood pressure stable.  She had leukocytosis.  Patient was admitted for the management of possible sepsis secondary to disseminated gonorrhea.  Chlamydia/gonorrhea probe positive for gonorrhea.  Case was discussed with ID, started on ceftriaxone and doxycycline. Now on doxycycline only.  Hospital course remarkable for persistent debilitating gonococcal polyarthralgia, patient was unable to ambulate.  Physical therapy following.  Started on scheduled NSAIDs.  Goal is to go home after ability to ambulate.  Assessment & Plan:   Principal Problem:   Sepsis (HCC) Active Problems:   Asthma   Polyarthralgia   Hypokalemia   Suspected sepsis: Ruled out.Presented with generalized malaise, arthralgia, fever.  Cultures have been sent,NGTD.    Currently afebrile and hemodynamically stable.  Gonorrhea/polyarthralgia:  Presented with pustular tender lesions on bilateral knees, palm .Also had polyarthralgia, tenosynovitis like picture. Chlamydia/gonorrhea probe positive for gonorrhea. She denied vaginal discharge.  She was  started on ceftriaxone, doxycycline as per ID.ID recommended discharge with 7 days course of doxycycline. She was discharged but unable to ambulate so remains inpatient.  Physical therapy recommended skilled nursing facility.  Polyarthralgias is secondary to gonorrhea.  Started on scheduled NSAIDs.  Goal is to go home after she is able to ambulate independently.  Holding on starting  steroids for now.  Hypokalemia: Supplemented with potassium  Intermittent asthma: Stable.  Continue albuterol as needed.  Right shoulder pain/right upper extremity swelling: Complained of severe pain on the right shoulder.Right shoulder was  edematous and tender.  No acute findings as per shoulder x-ray.  Venous Doppler negative for DVT.          DVT prophylaxis:SCD Code Status:Full  Family Communication: Discussed with the mother at bedside Status is: Inpatient  Dispo: The patient is from: Home              Anticipated d/c is to: Home              Anticipated d/c date is: 1 day              Patient currently needs to be inpatient because of inability to ambulate.        Consultants: ID.  Procedures:None  Antimicrobials:  Anti-infectives (From admission, onward)   Start     Dose/Rate Route Frequency Ordered Stop   08/31/19 0000  doxycycline (DORYX) 100 MG EC tablet     100 mg Oral 2 times daily 08/31/19 1507     08/30/19 2000  cefTRIAXone (ROCEPHIN) 1 g in sodium chloride 0.9 % 100 mL IVPB  Status:  Discontinued     1 g 200 mL/hr over 30 Minutes Intravenous Every 24 hours 08/29/19 2123 08/31/19 1753   08/29/19 2200  doxycycline (VIBRA-TABS) tablet 100 mg     100 mg Oral Every 12 hours 08/29/19 2123     08/29/19 2030  cefTRIAXone (ROCEPHIN) 1 g in sodium chloride 0.9 % 100 mL IVPB     1 g 200 mL/hr over 30 Minutes Intravenous  Once 08/29/19 2018 08/29/19 2119  Subjective:  Patient seen and examined at the bedside this morning.  Hemodynamically stable.  Afebrile.  Diffuse joint pain still there but have somewhat improved from yesterday.  Mother was at the bedside.  Objective: Vitals:   08/31/19 0551 08/31/19 1359 08/31/19 2123 09/01/19 0557  BP: 114/87 (!) 132/97 (!) 134/103 118/73  Pulse: 89 93 85 85  Resp: 16 14 14 18   Temp: 98.5 F (36.9 C) 98.3 F (36.8 C) 98.6 F (37 C) 98.5 F (36.9 C)  TempSrc: Oral Oral Oral Oral  SpO2: 95% 100% 100% 100%    Height:        Intake/Output Summary (Last 24 hours) at 09/01/2019 1302 Last data filed at 09/01/2019 1000 Gross per 24 hour  Intake 720 ml  Output 2300 ml  Net -1580 ml   There were no vitals filed for this visit.  Examination:   General exam: Morbidly obese, generalized weakness Respiratory system: Bilateral equal air entry, normal vesicular breath sounds, no wheezes or crackles  Cardiovascular system: S1 & S2 heard, RRR. No JVD, murmurs, rubs, gallops or clicks. Gastrointestinal system: Abdomen is nondistended, soft and nontender. No organomegaly or masses felt. Normal bowel sounds heard. Central nervous system: Alert and oriented. No focal neurological deficits. Extremities: No edema, no clubbing ,no cyanosis, tender right shoulder  skin: Healing pustules on bilateral knees, right palm   Data Reviewed: I have personally reviewed following labs and imaging studies  CBC: Recent Labs  Lab 08/29/19 1715 08/30/19 0516 08/31/19 0455  WBC 10.7* 9.7 10.1  NEUTROABS 8.2* 6.3 6.4  HGB 13.5 12.5 12.5  HCT 40.1 37.5 35.7*  MCV 90.1 91.2 86.4  PLT 216 218 144   Basic Metabolic Panel: Recent Labs  Lab 08/29/19 1715 08/30/19 0516 08/31/19 0455  NA 137 136 138  K 3.2* 3.3* 4.0  CL 102 106 106  CO2 25 21* 22  GLUCOSE 84 72 81  BUN 9 9 9   CREATININE 0.63 0.51 0.64  CALCIUM 8.7* 8.0* 8.3*  MG  --  2.0  --    GFR: CrCl cannot be calculated (Unknown ideal weight.). Liver Function Tests: Recent Labs  Lab 08/29/19 1715 08/30/19 0516  AST 41 28  ALT 64* 46*  ALKPHOS 89 71  BILITOT 0.8 0.7  PROT 7.6 6.2*  ALBUMIN 3.8 3.0*   No results for input(s): LIPASE, AMYLASE in the last 168 hours. No results for input(s): AMMONIA in the last 168 hours. Coagulation Profile: No results for input(s): INR, PROTIME in the last 168 hours. Cardiac Enzymes: Recent Labs  Lab 08/29/19 1715  CKTOTAL 38   BNP (last 3 results) No results for input(s): PROBNP in the last 8760  hours. HbA1C: No results for input(s): HGBA1C in the last 72 hours. CBG: No results for input(s): GLUCAP in the last 168 hours. Lipid Profile: No results for input(s): CHOL, HDL, LDLCALC, TRIG, CHOLHDL, LDLDIRECT in the last 72 hours. Thyroid Function Tests: No results for input(s): TSH, T4TOTAL, FREET4, T3FREE, THYROIDAB in the last 72 hours. Anemia Panel: No results for input(s): VITAMINB12, FOLATE, FERRITIN, TIBC, IRON, RETICCTPCT in the last 72 hours. Sepsis Labs: No results for input(s): PROCALCITON, LATICACIDVEN in the last 168 hours.  Recent Results (from the past 240 hour(s))  Wet prep, genital     Status: Abnormal   Collection Time: 08/29/19  5:08 PM   Specimen: PATH Cytology Cervicovaginal Ancillary Only  Result Value Ref Range Status   Yeast Wet Prep HPF POC NONE SEEN NONE SEEN Final  Trich, Wet Prep NONE SEEN NONE SEEN Final   Clue Cells Wet Prep HPF POC PRESENT (A) NONE SEEN Final   WBC, Wet Prep HPF POC FEW (A) NONE SEEN Final   Sperm NONE SEEN  Final    Comment: Performed at [facility], [address], [place], [place] [address]  Blood culture (routine x 2)     Status: None (Preliminary result)   Collection Time: 08/29/19  5:15 PM   Specimen: BLOOD LEFT ARM  Result Value Ref Range Status   Specimen Description BLOOD LEFT ARM  Final   Special Requests   Final    BOTTLES DRAWN AEROBIC AND ANAEROBIC Blood Culture adequate volume   Culture   Final    NO GROWTH 3 DAYS Performed at [facility], [address] [place], [place] [address]    Report Status PENDING  Incomplete  Blood culture (routine x 2)     Status: None (Preliminary result)   Collection Time: 08/29/19  6:00 PM   Specimen: BLOOD  Result Value Ref Range Status   Specimen Description BLOOD RIGHT ANTECUBITAL  Final   Special Requests   Final    BOTTLES DRAWN AEROBIC AND ANAEROBIC Blood Culture results may not be optimal due to an inadequate volume of blood received in culture  bottles   Culture   Final    NO GROWTH 3 DAYS Performed at [facility], [address] [place], [place] [address]    Report Status PENDING  Incomplete  SARS CORONAVIRUS 2 (TAT 6-24 HRS) Nasopharyngeal Nasopharyngeal Swab     Status: None   Collection Time: 08/29/19  8:51 PM   Specimen: Nasopharyngeal Swab  Result Value Ref Range Status   SARS Coronavirus 2 NEGATIVE NEGATIVE Final    Comment: (NOTE) SARS-CoV-2 target nucleic acids are NOT DETECTED. The SARS-CoV-2 RNA is generally detectable in upper and lower respiratory specimens during the acute phase of infection. Negative results do not preclude SARS-CoV-2 infection, do not rule out co-infections with other pathogens, and should not be used as the sole basis for treatment or other patient management decisions. Negative results must be combined with clinical observations, patient history, and epidemiological information. The expected result is Negative. Fact Sheet for Patients: HairSlick.no Fact Sheet for Healthcare Providers: quierodirigir.com This test is not yet approved or cleared by the [place] FDA and  has been authorized for detection and/or diagnosis of SARS-CoV-2 by FDA under an Emergency Use Authorization (EUA). This EUA will remain  in effect (meaning this test can be used) for the duration of the COVID-19 declaration under Section 56 4(b)(1) of the Act, 21 U.S.C. section 360bbb-3(b)(1), unless the authorization is terminated or revoked sooner. Performed at [facility], [address], [place], [place] [address]          Radiology Studies: DG Shoulder Right  Result Date: 08/30/2019 CLINICAL DATA:  24 year old female with right shoulder pain. EXAM: RIGHT SHOULDER - 2+ VIEW COMPARISON:  None. FINDINGS: There is no evidence of fracture or dislocation. There is no evidence of arthropathy or other focal bone abnormality. Soft tissues are  unremarkable. IMPRESSION: Negative. Electronically Signed   By: Elgie Collard M.D.   On: 08/30/2019 16:23   VAS [place] UPPER EXTREMITY VENOUS DUPLEX  Result Date: 08/31/2019 UPPER VENOUS STUDY  Indications: Edema, and Pain Comparison Study: no prior Performing Technologist: Jeb Levering RDMS, RVT  Examination Guidelines: A complete evaluation includes B-mode imaging, spectral Doppler, color Doppler, and  power Doppler as needed of all accessible portions of each vessel. Bilateral testing is considered an integral part of a complete examination. Limited examinations for reoccurring indications may be performed as noted.  Right Findings: +----------+------------+---------+-----------+----------+-------+ RIGHT     CompressiblePhasicitySpontaneousPropertiesSummary +----------+------------+---------+-----------+----------+-------+ IJV           Full       Yes       Yes                      +----------+------------+---------+-----------+----------+-------+ Subclavian    Full       Yes       Yes                      +----------+------------+---------+-----------+----------+-------+ Axillary      Full       Yes       Yes                      +----------+------------+---------+-----------+----------+-------+ Brachial      Full       Yes       Yes                      +----------+------------+---------+-----------+----------+-------+ Radial        Full                                          +----------+------------+---------+-----------+----------+-------+ Ulnar         Full                                          +----------+------------+---------+-----------+----------+-------+ Cephalic      Full                                          +----------+------------+---------+-----------+----------+-------+ Basilic       Full                                          +----------+------------+---------+-----------+----------+-------+  Left Findings:  +----+------------+---------+-----------+----------+--------------+ LEFTCompressiblePhasicitySpontaneousProperties   Summary     +----+------------+---------+-----------+----------+--------------+ IJV                                           Not visualized +----+------------+---------+-----------+----------+--------------+  Summary:  Right: No evidence of deep vein thrombosis in the upper extremity. No evidence of superficial vein thrombosis in the upper extremity.  *See table(s) above for measurements and observations.    Preliminary         Scheduled Meds: . doxycycline  100 mg Oral Q12H  . ibuprofen  600 mg Oral QID  . pantoprazole  40 mg Oral Daily   Continuous Infusions:    LOS: 1 day    Time spent:35 mins. More than 50% of that time was spent in counseling and/or coordination of care.      Burnadette Pop, MD [facility] P4/13/2021, 1:02 PM

## 2019-09-01 NOTE — Evaluation (Signed)
Occupational Therapy Evaluation Patient Details Name: Nancy Hudson MRN: [id number] DOB: Apr 20, 1996 Today's Date: 09/01/2019    History of Present Illness [name] is a 24 y.o. female with medical history significant for asthma and recent right shoulder strain. Patient presented to emergency department with approximately 5 days of joint aches, rash, chills, and malaise. Also noted small pustules appear over her elbow, knee, hand, and foot. Patient admitted with concern for sepsis with poyarthralgia. Patient positive for disseminated gonococcus.   Clinical Impression   Patient is a 24 year old female that moved with her mom to [place] in February, but has been staying with cousins in [place] recently. Both home locations have 1 to 3 flights of stairs to enter. Patient currently requiring mod A for transfers and mod to total A for self cares due to deficits listed below. Will continue to follow.    Follow Up Recommendations  SNF    Equipment Recommendations  Other (comment)(defer to next venue)       Precautions / Restrictions Precautions Precautions: Fall Restrictions Weight Bearing Restrictions: No      Mobility Bed Mobility Overal bed mobility: Needs Assistance Bed Mobility: Supine to Sit;Sit to Supine     Supine to sit: Min assist;Mod assist;HOB elevated Sit to supine: Mod assist   General bed mobility comments: min/mod A to sit up to mobilize L LE, heavy use of bed rails. mod A to lift L LE onto bed with x2 attempts due to limited carry over on first attempt to lay onto L shoulder vs laying straight back onto bed  Transfers Overall transfer level: Needs assistance Equipment used: Rolling walker (2 wheeled) Transfers: Sit to/from Stand Sit to Stand: Mod assist         General transfer comment: patient requires use of momentum, cues for body mechanics and x2-3 attempts before achieving standing position with mod A    Balance Overall balance assessment: Needs  assistance Sitting-balance support: Feet supported;Single extremity supported Sitting balance-Leahy Scale: Fair     Standing balance support: During functional activity;Bilateral upper extremity supported Standing balance-Leahy Scale: Poor Standing balance comment: reliant on B UE support                           ADL either performed or assessed with clinical judgement   ADL Overall ADL's : Needs assistance/impaired     Grooming: Minimal assistance;Moderate assistance;Sitting;Bed level Grooming Details (indicate cue type and reason): due to decreased grip strength and edema in dominant hand Upper Body Bathing: Moderate assistance;Sitting Upper Body Bathing Details (indicate cue type and reason): due to R UE pain/edema Lower Body Bathing: Maximal assistance   Upper Body Dressing : Moderate assistance;Maximal assistance;Sitting   Lower Body Dressing: Total assistance;Sitting/lateral leans;Bed level Lower Body Dressing Details (indicate cue type and reason): to don socks due to increased pain in R UE and L LE/knee mobility, edema Toilet Transfer: Moderate assistance;RW;BSC;Cueing for safety;Cueing for sequencing Toilet Transfer Details (indicate cue type and reason): simulated with functional mobility and side stepping to head of bed. requires increased time, max cues for sequencing with use of walker and seated rest break before reaching head of bed Toileting- Clothing Manipulation and Hygiene: Total assistance       Functional mobility during ADLs: Moderate assistance;Rolling walker;Cueing for safety;Cueing for sequencing General ADL Comments: patient requires increased assistance with self care due to pain, edema, decreased strength, activity tolerance  Pertinent Vitals/Pain Pain Assessment: Faces Faces Pain Scale: Hurts worst Pain Location: R hand/shoulder and left knee with mobility Pain Descriptors / Indicators:  Aching;Grimacing;Guarding;Moaning;Crying Pain Intervention(s): Monitored during session;Ice applied     Hand Dominance Right   Extremity/Trunk Assessment Upper Extremity Assessment RUE Deficits / Details: edema noted throughout R UE, greatest distally. patient has difficulty with opposing to 4th and 5th digits. AROM of shoulder and elbow grossly intact to touch her head however with increased pain and increased time required impacting function for self care RUE: Unable to fully assess due to pain LUE Deficits / Details: patient demonstrates grossly intact AROM of shoulder, elbow and hand    Lower Extremity Assessment Lower Extremity Assessment: Defer to PT evaluation       Communication Communication Communication: No difficulties   Cognition Arousal/Alertness: Awake/alert Behavior During Therapy: WFL for tasks assessed/performed Overall Cognitive Status: Within Functional Limits for tasks assessed                                 General Comments: pt agreeable to work with therapy and motivated to mobilize however limited by pain.    General Comments  educate patient on ROM exercises to perform in bed to alleviate swelling/regain function as well as elevating R UE             Home Living Family/patient expects to be discharged to:: Private residence Living Arrangements: Parent Available Help at Discharge: Family Type of Home: Apartment Home Access: Stairs to enter Secretary/administrator of Steps: 1 flight at home in [place], [place]. 3 flights at [employer] in [place]. Entrance Stairs-Rails: Left;Right;Can reach both(both at home, 1 at cousins) Home Layout: One level     Bathroom Shower/Tub: Chief Strategy Officer: Standard Bathroom Accessibility: Yes How Accessible: Accessible via walker Home Equipment: None          Prior Functioning/Environment Level of Independence: Independent        Comments: pt has been fully independent with  ADL's, mobility, and was driving and job searching in [place] where she moved with her mother in February.         OT Problem List: Decreased strength;Decreased range of motion;Decreased activity tolerance;Impaired balance (sitting and/or standing);Decreased safety awareness;Decreased knowledge of use of DME or AE;Obesity;Pain;Increased edema;Impaired UE functional use      OT Treatment/Interventions: Self-care/ADL training;Therapeutic exercise;DME and/or AE instruction;Therapeutic activities;Patient/family education;Balance training    OT Goals(Current goals can be found in the care plan section) Acute Rehab OT Goals Patient Stated Goal: to get back to her normal independence OT Goal Formulation: With patient Time For Goal Achievement: 09/15/19 Potential to Achieve Goals: Good  OT Frequency: Min 2X/week   Barriers to D/C: Inaccessible home environment  stairs to enter           AM-PAC OT "6 Clicks" Daily Activity     Outcome Measure Help from another person eating meals?: None Help from another person taking care of personal grooming?: A Little Help from another person toileting, which includes using toliet, bedpan, or urinal?: A Lot Help from another person bathing (including washing, rinsing, drying)?: A Lot Help from another person to put on and taking off regular upper body clothing?: A Lot Help from another person to put on and taking off regular lower body clothing?: A Lot 6 Click Score: 15   End of Session Equipment Utilized During Treatment: Rolling walker Nurse Communication: Mobility status  Activity Tolerance: Patient limited by pain;Other (comment)(highly motivated) Patient left: in bed;with call bell/phone within reach;with family/visitor present  OT Visit Diagnosis: Unsteadiness on feet (R26.81);Other abnormalities of gait and mobility (R26.89);Muscle weakness (generalized) (M62.81);Pain Pain - part of body: (R shoulder, L knee)                Time:  3254-9826 OT Time Calculation (min): 38 min Charges:  OT General Charges $OT Visit: 1 Visit OT Evaluation $OT Eval Moderate Complexity: 1 Mod OT Treatments $Self Care/Home Management : 23-37 mins  Marlyce Huge OT Pager: [phone]  Carmelia Roller 09/01/2019, 1:06 PM

## 2019-09-01 NOTE — Progress Notes (Signed)
Physical Therapy Treatment Patient Details Name: Nancy Hudson MRN: [id number] DOB: [birth year] Today's Date: 09/01/2019    History of Present Illness Nancy Hudson is a 24 y.o. female with medical history significant for asthma and recent right shoulder strain. Patient presented to emergency department with approximately 5 days of joint aches, rash, chills, and malaise. Also noted small pustules appear over her elbow, knee, hand, and foot. Patient admitted with concern for sepsis with poyarthralgia. Patient positive for disseminated gonococcus.    PT Comments    Patient continues to be highly motivated to progress with therapy. She remains greatly limited by pain and edema at multiple joints and was able to progress mobility. Pt continues to require 2+ assist for sit<>stand transfers with RW and is unable to take steps with RW due to significant pain in Rt hand and bil knees in standing. She was able to take several small steps at EOB with bil platform walker and transferred to the recliner with use of Stedy. Pt will continue to benefit from skilled Acute therapy services as she will need to be able to ascend a full flight of stairs safely to return home. Acute PT will progress as able.    Follow Up Recommendations  SNF;Supervision/Assistance - 24 hour     Equipment Recommendations  Other (comment)(TBD pending progress. At this time pt would need RW, BSC, WC)    Recommendations for Other Services OT consult     Precautions / Restrictions Precautions Precautions: Fall Restrictions Weight Bearing Restrictions: No    Mobility  Bed Mobility Overal bed mobility: Needs Assistance Bed Mobility: Supine to Sit     Supine to sit: Mod assist;HOB elevated Sit to supine: Mod assist   General bed mobility comments: pt realies heavily on bed rails for assist to sit up and pivot. pt continues to require assist for Lt LE mobility, and mod assist required to pivot hips fully to sit EOB and place feet on  floor.   Transfers Overall transfer level: Needs assistance Equipment used: Rolling walker (2 wheeled);Bilateral platform walker Transfers: Sit to/from [facility] Sit to Stand: Mod assist;From elevated surface;+2 safety/equipment;+2 physical assistance Stand pivot transfers: Total assist;+2 safety/equipment       General transfer comment: pt continues to require +2 assist for safety and physical assist to rise from EOB and was able to compete 1x wtih RW and 1x with bil platform walker. Pt was able to take ~ 5 small side steps towards Rt at EOB with platform walker for increaed UE support and to ease pain in Rt hand. Pt unsafe to complete stand step with walker at this time as Rt knee buckled in standing on 2 occasions and Lt knee on 1 occasion. Pt performed sit<>stand with Stedy and used to transfer to bedside recliner.   Ambulation/Gait      General Gait Details: not tested due to pain and safety concerns   Stairs      Wheelchair Mobility    Modified Rankin (Stroke Patients Only)       Balance Overall balance assessment: Needs assistance Sitting-balance support: Feet supported;Single extremity supported Sitting balance-Leahy Scale: Fair     Standing balance support: During functional activity;Bilateral upper extremity supported Standing balance-Leahy Scale: Poor Standing balance comment: pt reliant on UE support or blocking at knees. Pt able to complete bil UE reaching while standing in [place]. She was abel to stand for ~ 4 minutes and complete face washing with cloth.         Cognition  Arousal/Alertness: Awake/alert Behavior During Therapy: WFL for tasks assessed/performed Overall Cognitive Status: Within Functional Limits for tasks assessed        General Comments: pt remains agreeable to work with therapy and motivated to mobilize however greatly limited by pain. pt excited to report she took 2 small steps with OT this AM.      Exercises  General Exercises - Lower Extremity Long Arc Quad: Both;5 reps;AROM;Seated    General Comments General comments (skin integrity, edema, etc.): educate patient on ROM exercises to perform in bed to alleviate swelling/regain function as well as elevating R UE       Pertinent Vitals/Pain Pain Assessment: 0-10 Faces Pain Scale: Hurts worst Pain Location: Rt hand and shoulder, Left hand and fingers, Lt knee and foot, Rt foot. Pain Descriptors / Indicators: Aching;Grimacing;Guarding;Moaning Pain Intervention(s): Limited activity within patient's tolerance;Monitored during session;Repositioned    Home Living Family/patient expects to be discharged to:: Private residence Living Arrangements: Parent Available Help at Discharge: Family Type of Home: Apartment Home Access: Stairs to enter Entrance Stairs-Rails: Left;Right;Can reach both(both at home, 1 at cousins) Home Layout: One level Home Equipment: None      Prior Function Level of Independence: Independent      Comments: pt has been fully independent with ADL's, mobility, and was driving and job searching in [place] where she moved with her mother in February.    PT Goals (current goals can now be found in the care plan section) Acute Rehab PT Goals Patient Stated Goal: to get back to her normal independence PT Goal Formulation: With patient/family Time For Goal Achievement: 09/14/19 Potential to Achieve Goals: Good Progress towards PT goals: Progressing toward goals    Frequency    Min 4X/week      PT Plan Current plan remains appropriate       AM-PAC PT "6 Clicks" Mobility   Outcome Measure  Help needed turning from your back to your side while in a flat bed without using bedrails?: A Lot Help needed moving from lying on your back to sitting on the side of a flat bed without using bedrails?: A Lot Help needed moving to and from a bed to a chair (including a wheelchair)?: Total Help needed standing up from a chair  using your arms (e.g., wheelchair or bedside chair)?: A Lot Help needed to walk in hospital room?: Total Help needed climbing 3-5 steps with a railing? : Total 6 Click Score: 9    End of Session Equipment Utilized During Treatment: Gait belt Activity Tolerance: Patient limited by pain;Patient tolerated treatment well Patient left: with call bell/phone within reach;with family/visitor present;in chair;with chair alarm set Nurse Communication: Mobility status;Other (comment);Need for lift equipment PT Visit Diagnosis: Difficulty in walking, not elsewhere classified (R26.2);Pain;Other abnormalities of gait and mobility (R26.89) Pain - Right/Left: (Bilateral) Pain - part of body: Hand;Shoulder;Arm;Ankle and joints of foot;Knee     Time: 1610-9604 PT Time Calculation (min) (ACUTE ONLY): 38 min  Charges:  $Therapeutic Activity: 38-52 mins                     Wynn Maudlin, DPT Physical Therapist with [facility] [phone]  09/01/2019 2:08 PM

## 2019-09-02 DIAGNOSIS — M2550 Pain in unspecified joint: Secondary | ICD-10-CM | POA: Diagnosis not present

## 2019-09-02 DIAGNOSIS — M25462 Effusion, left knee: Secondary | ICD-10-CM | POA: Diagnosis not present

## 2019-09-02 DIAGNOSIS — A549 Gonococcal infection, unspecified: Secondary | ICD-10-CM | POA: Diagnosis not present

## 2019-09-02 DIAGNOSIS — E876 Hypokalemia: Secondary | ICD-10-CM | POA: Diagnosis not present

## 2019-09-02 LAB — BASIC METABOLIC PANEL
Anion gap: 8 (ref 5–15)
BUN: 12 mg/dL (ref 6–20)
CO2: 25 mmol/L (ref 22–32)
Calcium: 8.5 mg/dL — ABNORMAL LOW (ref 8.9–10.3)
Chloride: 105 mmol/L (ref 98–111)
Creatinine, Ser: 0.57 mg/dL (ref 0.44–1.00)
GFR calc Af Amer: >60 mL/min (ref >60)
GFR calc non Af Amer: >60 mL/min (ref >60)
Glucose, Bld: 100 mg/dL — ABNORMAL HIGH (ref 70–99)
Potassium: 3.8 mmol/L (ref 3.5–5.1)
Sodium: 138 mmol/L (ref 135–145)

## 2019-09-02 LAB — SYNOVIAL CELL COUNT + DIFF, W/ CRYSTALS
Crystals, Fluid: NONE SEEN
Lymphocytes-Synovial Fld: 2 % (ref 0–20)
Monocyte-Macrophage-Synovial Fluid: 7 % — ABNORMAL LOW (ref 50–90)
Neutrophil, Synovial: 91 % — ABNORMAL HIGH (ref 0–25)
WBC, Synovial: 6200 /mm3 — ABNORMAL HIGH (ref 0–200)

## 2019-09-02 MED ORDER — SODIUM CHLORIDE 0.9 % IV SOLN
1.0000 g | INTRAVENOUS | Status: DC
  Administered 2019-09-02 – 2019-09-04 (×3): 1 g via INTRAVENOUS
  Filled 2019-09-02: qty 1
  Filled 2019-09-02: qty 10
  Filled 2019-09-02: qty 1

## 2019-09-02 NOTE — Progress Notes (Signed)
Knee aspirate culture showed WBC present no organisms. MD notified.

## 2019-09-02 NOTE — Consult Note (Addendum)
Nancy Hudson is an 24 y.o. female.    Chief Complaint: left knee pain and swelling  HPI: 24 y/o healthy female admitted for sepsis 4 days ago after developing 5 days of joint aches, malaise and chills. Pt noted increased effusion in the left knee with no known injury. Pt is positive for disseminated gonococcus from prior workup. Pt states her knee has been feeling better over the past 2 days since rocephin treatment started with IV. She denies any other symptoms. Denies any pain other than the left knee with minimal pain to right knee. Increased pain with weight bearing initially but has improved currently.   PCP:  Patient, No Pcp Per  PMH: Past Medical History:  Diagnosis Date  . Asthma     PSes:  No Known Allergies  Medications: Current Facility-Administered Medications  Medication Dose Route Frequency Provider Last Rate Last Admin  . acetaminophen (TYLENOL) tablet 650 mg  650 mg Oral Q6H PRN Opyd, Ilene Qua, MD   650 mg at 09/02/19 0631   Or  . acetaminophen (TYLENOL) suppository 650 mg  650 mg Rectal Q6H PRN Opyd, Ilene Qua, MD      . albuterol (PROVENTIL) (2.5 MG/3ML) 0.083% nebulizer solution 2.5 mg  2.5 mg Nebulization Q6H PRN Opyd, Ilene Qua, MD      . cefTRIAXone (ROCEPHIN) 1 g in sodium chloride 0.9 % 100 mL IVPB  1 g Intravenous Q24H Mariel Aloe, MD 200 mL/hr at 09/02/19 1013 1 g at 09/02/19 1013  . ibuprofen (ADVIL) tablet 600 mg  600 mg Oral QID Shelly Coss, MD   600 mg at 09/02/19 1014  . morphine 2 MG/ML injection 2 mg  2 mg Intravenous Q4H PRN Shelly Coss, MD   2 mg at 09/01/19 2142  . ondansetron (ZOFRAN) tablet 4 mg  4 mg Oral Q6H PRN Opyd, Ilene Qua, MD       Or  . ondansetron (ZOFRAN) injection 4 mg  4 mg Intravenous Q6H PRN Opyd, Ilene Qua, MD      . pantoprazole (PROTONIX) EC tablet 40 mg  40 mg Oral Daily Shelly Coss, MD   40 mg at 09/02/19 1014  . polyvinyl alcohol (LIQUIFILM TEARS) 1.4 % ophthalmic solution 1 drop  1 drop Both Eyes PRN  Shelly Coss, MD      . senna-docusate (Senokot-S) tablet 1 tablet  1 tablet Oral QHS PRN Opyd, Ilene Qua, MD        Results for orders placed or performed during the hospital encounter of 08/29/19 (from the past 48 hour(s))  Basic metabolic panel     Status: Abnormal   Collection Time: 09/02/19  4:49 AM  Result Value Ref Range   Sodium 138 135 - 145 mmol/L   Potassium 3.8 3.5 - 5.1 mmol/L   Chloride 105 98 - 111 mmol/L   CO2 25 22 - 32 mmol/L   Glucose, Bld 100 (H) 70 - 99 mg/dL    Comment: Glucose reference range applies only to samples taken after fasting for at least 8 hours.   BUN 12 6 - 20 mg/dL   Creatinine, Ser 0.57 0.44 - 1.00 mg/dL   Calcium 8.5 (L) 8.9 - 10.3 mg/dL   GFR calc non Af Amer >60 >60 mL/min   GFR calc Af Amer >60 >60 mL/min   Anion gap 8 5 - 15    Comment: Performed at [facility] [place] [address] [place], [place] [address]   No results found.  ROS:  ROS  Pain and swelling to bilateral knees, left greater than right  Physical Exam: Alert and appropriate 24 y/o female in no acute distress Bilateral knees with mild effusion in the left and decreased rom due to pain Right knee with full rom, no deformity No increased warmth or erythema to either knee/leg nv intact distally No rashes or signs of cellulitis  Secondary EXAM: AAO/ NAD, left knee with minimal swelling and tenderness and mild pain with AROM. Improved per the patient from prior to the aspiration. No cords and Neg Homans Distally NVI Right shoulder exam: minimal swelling and some generalized tenderness in the myofascial planes No obvious effusion. No pain with gentle PROM Good IR and ER strength against resistance  Assessment/Plan Assessment: left knee effusion with history of disseminated gonococcus  Plan: Recommend aspiration and cultures to assess for septic gonococcus in the left knee If the gram stain reveals positive gonococcus, recommend knee arthroscopy to  clean the knee  Recommend NPO until labs return to allow [place] to complete surgery if needed  Patient and family in agreement Continue with IV antibiotics and prior treatment Will monitor her lab work and stat gram stain.  Procedure:  After sterile prep and consent, 30ccs of slight turbid yellow fluid extracted from the knee and sent to the lab Pt tolerated the procedure well  Sterile dressing placed    LAB follow up: Neg Gram Stain and 6800 WBCs  Brad Antonieta Iba, MPAS [facility] is now [facility] [address], [place], [place] [address] Phone: [phone] [email or web address] Facebook  Instagram  [employer]    I agree with Thea Gist PA-C and feel based upon her exam this afternoon and the results of her synovial fluid analysis from her knee that she does not have a Gonococcal infection of her knee. Recommend ice and rest and PT for the knee with follow up in [place] where she lives as needed.  Her shoulder is likely a strain due to lifting her aunt. Recommend rest and ice for the shoulder. Will order a sling for her for when she is up. Activity as tolerated with the arm. Follow up as needed in [place].   Thank You,  Beverely Low MD

## 2019-09-02 NOTE — Progress Notes (Signed)
Physical Therapy Treatment Patient Details Name: Nancy Hudson MRN: [id number] DOB: 1996-05-17 Today's Date: 09/02/2019    History of Present Illness Nancy Hudson is a 24 y.o. female with medical history significant for asthma and recent right shoulder strain. Patient presented to emergency department with approximately 5 days of joint aches, rash, chills, and malaise. Also noted small pustules appear over her elbow, knee, hand, and foot. Patient admitted with concern for sepsis with poyarthralgia. Patient positive for disseminated gonococcus.    PT Comments    Patient is progressing steadily with Acute therapy and advanced mobility to ambulation today. She demonstrated increased Lt LE initiation with bed mobility and continues to require min-mod assist to scoot to EOB. Pt continues to require 2+ assist for sit<>stand transfers and bil platform walker for support; pt with improved posture in standing today. Pt able to take short steps with [place] walker to step 8' to room doorway; chair follow for safety. She will continue to benefit from skilled PT interventions to progress functional independence and mobility.   Follow Up Recommendations  SNF;Supervision/Assistance - 24 hour     Equipment Recommendations  (TBD pending progress. At this time pt would need RW, BSC, WC)    Recommendations for Other Services OT consult     Precautions / Restrictions Precautions Precautions: Fall Restrictions Weight Bearing Restrictions: No    Mobility  Bed Mobility Overal bed mobility: Needs Assistance Bed Mobility: Supine to Sit     Supine to sit: Min assist;Mod assist;HOB elevated     General bed mobility comments: pt continues to improve mobility in bed, perfoming long sit to raise trunk with decreased use of bed rail to initiate. pt able to initaite and move Lt LE towards EOB but min assist needed to complete bring LE off EOB. Mod assist to scoot forward and reposition to place feet on the  floor.  Transfers Overall transfer level: Needs assistance Equipment used: Bilateral platform walker Transfers: Sit to/from Stand;Stand Pivot Transfers Sit to Stand: Mod assist;From elevated surface;+2 safety/equipment;+2 physical assistance        General transfer comment: pt requires mod assist to rise from elevated EOB to [facility] walker. Patient with improved posture after rising to stand today and able to straighten to fully upright position.  Ambulation/Gait Ambulation/Gait assistance: Mod assist;+2 physical assistance;+2 safety/equipment Gait Distance (Feet): 8 Feet Assistive device: Bilateral platform walker Gait Pattern/deviations: Step-through pattern;Decreased step length - left;Decreased step length - right;Decreased stride length;Trunk flexed Gait velocity: decreased   General Gait Details: pt requries mod assist to steadya nd clsoe chair follow for safety. Assist to manually facilitate Lt knee extension during stance phase and to manage Harmon Pier walker. pt heavily reliant on UE support and cues required to increased weight bearing on bil forearms to prevent bil LE buckling.   Stairs        Wheelchair Mobility    Modified Rankin (Stroke Patients Only)       Balance Overall balance assessment: Needs assistance Sitting-balance support: Feet supported;Single extremity supported Sitting balance-Leahy Scale: Good     Standing balance support: During functional activity;Bilateral upper extremity supported Standing balance-Leahy Scale: Poor Standing balance comment: pt heavily reliant on external support           Cognition Arousal/Alertness: Awake/alert Behavior During Therapy: WFL for tasks assessed/performed Overall Cognitive Status: Within Functional Limits for tasks assessed          General Comments: pt's pain is improving and pt is highly motivated to participate in therapy  Pertinent Vitals/Pain Pain Assessment: Faces Faces Pain Scale:  Hurts even more Pain Location: Rt thumb, shoulder, and Lt knee Pain Descriptors / Indicators: Aching;Grimacing;Guarding;Moaning Pain Intervention(s): Limited activity within patient's tolerance;Monitored during session;Repositioned           PT Goals (current goals can now be found in the care plan section) Acute Rehab PT Goals Patient Stated Goal: to get back to her normal independence PT Goal Formulation: With patient/family Time For Goal Achievement: 09/14/19 Potential to Achieve Goals: Good Progress towards PT goals: Progressing toward goals    Frequency    Min 4X/week      PT Plan Current plan remains appropriate    Co-evaluation              AM-PAC PT "6 Clicks" Mobility   Outcome Measure  Help needed turning from your back to your side while in a flat bed without using bedrails?: A Lot Help needed moving from lying on your back to sitting on the side of a flat bed without using bedrails?: A Lot Help needed moving to and from a bed to a chair (including a wheelchair)?: Total Help needed standing up from a chair using your arms (e.g., wheelchair or bedside chair)?: A Lot Help needed to walk in hospital room?: Total Help needed climbing 3-5 steps with a railing? : Total 6 Click Score: 9    End of Session Equipment Utilized During Treatment: Gait belt Activity Tolerance: Patient limited by pain;Patient tolerated treatment well Patient left: with call bell/phone within reach;with family/visitor present;in chair;with chair alarm set Nurse Communication: Mobility status;Need for lift equipment PT Visit Diagnosis: Difficulty in walking, not elsewhere classified (R26.2);Pain;Other abnormalities of gait and mobility (R26.89) Pain - part of body: Hand;Shoulder;Knee     Time: 8413-2440 PT Time Calculation (min) (ACUTE ONLY): 25 min  Charges:  $Gait Training: 8-22 mins $Therapeutic Activity: 8-22 mins                     Wynn Maudlin, DPT Physical Therapist  with [facility] [phone]  09/02/2019 3:36 PM

## 2019-09-02 NOTE — Progress Notes (Signed)
PROGRESS NOTE    Nancy Hudson  BRA:[id number] DOB: Jun 20, 1995 DOA: 08/29/2019 PCP: Patient, No Pcp Per   Brief Narrative: Nancy Hudson is a 24 y.o. female with a history of asthma. She presented secondary    Assessment & Plan:   Principal Problem:   Sepsis ([place]) Active Problems:   Asthma   Polyarthralgia   Hypokalemia   Suspected sepsis Criteria met on admission. In setting of gonorrhea with concern for disseminated gonorrhea. Patient managed initially on Ceftriaxone IV. Blood cultures obtained and are negative. Transitioned to Doxycycline on 4/10. Placed back on Ceftriaxone IV on 4/14  Gonorrhea Concern for disseminated gonorrhea in setting of polyarthralgia. -Discontinue doxycycline and restart Ceftriaxone IV  Polyarthralgia Likely secondary to above. Upper extremity venous duplex negative for DVT -Orthopedic surgery consult for left knee arthrocentesis (obtain cell count and culture) and evaluation of right shoulder  Hypokalemia Resolved.   DVT prophylaxis: SCDs Code Status:   Code Status: Full Code Family Communication: Mother on telephone. Disposition Plan: Discharge pending continued workup by orthopedic surgery and transition to oral antibiotics once concern for severe infection ruled out. Plan to discharge home rather to SNF in anticipation of pain improving after arthrocentesis.    Consultants:   Orthopedic surgery  Procedures:   None  Antimicrobials:  Ceftriaxone IV  Doxycycline    Subjective: Still with some left knee, right shoulder pain. Cannot ambulate well. Poor range of motion of affected joints.  Objective: Vitals:   09/01/19 1347 09/01/19 1440 09/01/19 2203 09/02/19 0536  BP: (!) 140/102 (!) 131/98 (!) 133/92 123/80  Pulse: 91  95 75  Resp: 18  16 16   Temp: 97.8 F (36.6 C)  98.4 F (36.9 C) 98.1 F (36.7 C)  TempSrc: Oral  Oral Oral  SpO2: 99%  99% 93%  Height:        Intake/Output Summary (Last 24 hours) at 09/02/2019  0919 Last data filed at 09/02/2019 0600 Gross per 24 hour  Intake 840 ml  Output 1000 ml  Net -160 ml   There were no vitals filed for this visit.  Examination:  General exam: Appears calm and comfortable Respiratory system: Clear to auscultation. Respiratory effort normal. Cardiovascular system: S1 & S2 heard, RRR. No murmurs, rubs, gallops or clicks. Gastrointestinal system: Abdomen is nondistended, soft and nontender. No organomegaly or masses felt. Normal bowel sounds heard. Central nervous system: Alert and oriented. No focal neurological deficits. Extremities: Left knee is swollen, tender and has significant effusion. Limited ROM. Right hand slightly swollen at first MTP joint Skin: No cyanosis. No rashes Psychiatry: Judgement and insight appear normal. Mood & affect appropriate.     Data Reviewed: I have personally reviewed following labs and imaging studies  CBC: Recent Labs  Lab 08/29/19 1715 08/30/19 0516 08/31/19 0455  WBC 10.7* 9.7 10.1  NEUTROABS 8.2* 6.3 6.4  HGB 13.5 12.5 12.5  HCT 40.1 37.5 35.7*  MCV 90.1 91.2 86.4  PLT 216 218 881   Basic Metabolic Panel: Recent Labs  Lab 08/29/19 1715 08/30/19 0516 08/31/19 0455 09/02/19 0449  NA 137 136 138 138  K 3.2* 3.3* 4.0 3.8  CL 102 106 106 105  CO2 25 21* 22 25  GLUCOSE 84 72 81 100*  BUN 9 9 9 12   CREATININE 0.63 0.51 0.64 0.57  CALCIUM 8.7* 8.0* 8.3* 8.5*  MG  --  2.0  --   --    GFR: CrCl cannot be calculated (Unknown ideal weight.). Liver Function Tests: Recent Labs  Lab 08/29/19 1715 08/30/19 0516  AST 41 28  ALT 64* 46*  ALKPHOS 89 71  BILITOT 0.8 0.7  PROT 7.6 6.2*  ALBUMIN 3.8 3.0*   No results for input(s): LIPASE, AMYLASE in the last 168 hours. No results for input(s): AMMONIA in the last 168 hours. Coagulation Profile: No results for input(s): INR, PROTIME in the last 168 hours. Cardiac Enzymes: Recent Labs  Lab 08/29/19 1715  CKTOTAL 38   BNP (last 3 results) No  results for input(s): PROBNP in the last 8760 hours. HbA1C: No results for input(s): HGBA1C in the last 72 hours. CBG: No results for input(s): GLUCAP in the last 168 hours. Lipid Profile: No results for input(s): CHOL, HDL, LDLCALC, TRIG, CHOLHDL, LDLDIRECT in the last 72 hours. Thyroid Function Tests: No results for input(s): TSH, T4TOTAL, FREET4, T3FREE, THYROIDAB in the last 72 hours. Anemia Panel: No results for input(s): VITAMINB12, FOLATE, FERRITIN, TIBC, IRON, RETICCTPCT in the last 72 hours. Sepsis Labs: No results for input(s): PROCALCITON, LATICACIDVEN in the last 168 hours.  Recent Results (from the past 240 hour(s))  Wet prep, genital     Status: Abnormal   Collection Time: 08/29/19  5:08 PM   Specimen: PATH Cytology Cervicovaginal Ancillary Only  Result Value Ref Range Status   Yeast Wet Prep HPF POC NONE SEEN NONE SEEN Final   Trich, Wet Prep NONE SEEN NONE SEEN Final   Clue Cells Wet Prep HPF POC PRESENT (A) NONE SEEN Final   WBC, Wet Prep HPF POC FEW (A) NONE SEEN Final   Sperm NONE SEEN  Final    Comment: Performed at [facility], [place] [address], [place], [place] [address]  Blood culture (routine x 2)     Status: None (Preliminary result)   Collection Time: 08/29/19  5:15 PM   Specimen: BLOOD LEFT ARM  Result Value Ref Range Status   Specimen Description BLOOD LEFT ARM  Final   Special Requests   Final    BOTTLES DRAWN AEROBIC AND ANAEROBIC Blood Culture adequate volume   Culture   Final    NO GROWTH 3 DAYS Performed at [facility], [address], [place], [place] [address]    Report Status PENDING  Incomplete  Blood culture (routine x 2)     Status: None (Preliminary result)   Collection Time: 08/29/19  6:00 PM   Specimen: BLOOD  Result Value Ref Range Status   Specimen Description BLOOD RIGHT ANTECUBITAL  Final   Special Requests   Final    BOTTLES DRAWN AEROBIC AND ANAEROBIC Blood Culture results may not be optimal due to an  inadequate volume of blood received in culture bottles   Culture   Final    NO GROWTH 3 DAYS Performed at [facility], [place] [address], [place], [place] [address]    Report Status PENDING  Incomplete  SARS CORONAVIRUS 2 (TAT 6-24 HRS) Nasopharyngeal Nasopharyngeal Swab     Status: None   Collection Time: 08/29/19  8:51 PM   Specimen: Nasopharyngeal Swab  Result Value Ref Range Status   SARS Coronavirus 2 NEGATIVE NEGATIVE Final    Comment: (NOTE) SARS-CoV-2 target nucleic acids are NOT DETECTED. The SARS-CoV-2 RNA is generally detectable in upper and lower respiratory specimens during the acute phase of infection. Negative results do not preclude SARS-CoV-2 infection, do not rule out co-infections with other pathogens, and should not be used as the sole basis for treatment or other patient management decisions. Negative  results must be combined with clinical observations, patient history, and epidemiological information. The expected result is Negative. Fact Sheet for Patients: SugarRoll.be Fact Sheet for Healthcare Providers: https://www.woods-mathews.com/ This test is not yet approved or cleared by the [place] FDA and  has been authorized for detection and/or diagnosis of SARS-CoV-2 by FDA under an Emergency Use Authorization (EUA). This EUA will remain  in effect (meaning this test can be used) for the duration of the COVID-19 declaration under Section 56 4(b)(1) of the Act, 21 U.S.C. section 360bbb-3(b)(1), unless the authorization is terminated or revoked sooner. Performed at [facility], [place] [address] [place], [place] [address]          Radiology Studies: VAS [place] UPPER EXTREMITY VENOUS DUPLEX  Result Date: 09/01/2019 UPPER VENOUS STUDY  Indications: Edema, and Pain Comparison Study: no prior Performing Technologist: June Leap RDMS, RVT  Examination Guidelines: A complete evaluation includes B-mode imaging,  spectral Doppler, color Doppler, and power Doppler as needed of all accessible portions of each vessel. Bilateral testing is considered an integral part of a complete examination. Limited examinations for reoccurring indications may be performed as noted.  Right Findings: +----------+------------+---------+-----------+----------+-------+ RIGHT     CompressiblePhasicitySpontaneousPropertiesSummary +----------+------------+---------+-----------+----------+-------+ IJV           Full       Yes       Yes                      +----------+------------+---------+-----------+----------+-------+ Subclavian    Full       Yes       Yes                      +----------+------------+---------+-----------+----------+-------+ Axillary      Full       Yes       Yes                      +----------+------------+---------+-----------+----------+-------+ Brachial      Full       Yes       Yes                      +----------+------------+---------+-----------+----------+-------+ Radial        Full                                          +----------+------------+---------+-----------+----------+-------+ Ulnar         Full                                          +----------+------------+---------+-----------+----------+-------+ Cephalic      Full                                          +----------+------------+---------+-----------+----------+-------+ Basilic       Full                                          +----------+------------+---------+-----------+----------+-------+  Left Findings: +----+------------+---------+-----------+----------+--------------+ LEFTCompressiblePhasicitySpontaneousProperties   Summary     +----+------------+---------+-----------+----------+--------------+ IJV  Not visualized +----+------------+---------+-----------+----------+--------------+  Summary:  Right: No evidence of deep vein  thrombosis in the upper extremity. No evidence of superficial vein thrombosis in the upper extremity.  *See table(s) above for measurements and observations.  Diagnosing physician: Deitra Mayo MD Electronically signed by Deitra Mayo MD on 09/01/2019 at 3:26:30 PM.    Final         Scheduled Meds: . doxycycline  100 mg Oral Q12H  . ibuprofen  600 mg Oral QID  . pantoprazole  40 mg Oral Daily   Continuous Infusions:   LOS: 2 days     Cordelia Poche, MD [facility] 09/02/2019, 9:19 AM  If 7PM-7AM, please contact night-coverage [email or web address]

## 2019-09-03 ENCOUNTER — Other Ambulatory Visit: Payer: Self-pay

## 2019-09-03 ENCOUNTER — Encounter (HOSPITAL_COMMUNITY): Payer: Self-pay | Admitting: Internal Medicine

## 2019-09-03 DIAGNOSIS — A549 Gonococcal infection, unspecified: Secondary | ICD-10-CM | POA: Diagnosis not present

## 2019-09-03 DIAGNOSIS — E876 Hypokalemia: Secondary | ICD-10-CM | POA: Diagnosis not present

## 2019-09-03 DIAGNOSIS — M2550 Pain in unspecified joint: Secondary | ICD-10-CM | POA: Diagnosis not present

## 2019-09-03 LAB — CULTURE, BLOOD (ROUTINE X 2)
Culture: NO GROWTH
Culture: NO GROWTH
Special Requests: ADEQUATE

## 2019-09-03 MED ORDER — DIPHENHYDRAMINE HCL 25 MG PO CAPS
25.0000 mg | ORAL_CAPSULE | Freq: Once | ORAL | Status: AC
  Administered 2019-09-03: 25 mg via ORAL
  Filled 2019-09-03: qty 1

## 2019-09-03 NOTE — TOC Initial Note (Addendum)
Transition of Care [facility]) - Initial/Assessment Note    Patient Details  Name: Nancy Hudson MRN: [id number] Date of Birth: March 18, 1996  Transition of [facility] CM/SW Contact:    Lia Hopping, [place] Phone Number: 09/03/2019, 3:51 PM  Clinical Narrative:                 CSW met with the patient and her mother at bedside to discuss discharge plan home, returning to [place]. Patient lives in an apartment with her mother. Patient mother is concern about patient ability to climb 13  flight of stairs. She is hoping the patient will make progress with therapy to do so. Patient mother inquired about an alternative plan if the patient still has difficulty walking. CSW informed her to call [facility] non emergency EMS or the fire department to determine if they can assist. Mother plans to call.   CSW inquired about DME needs/recommendations. Patient and mother are agreeable to RW , 3 in1, and decline WC.   CSW attempted to arrange Home Health services in [place]. CSW was informed the patient will have to go through a  provider in [place] to have [place] arranged. Patient mother will call  PCP once she returns to [place].   Expected Discharge Plan: [place] Barriers to Discharge: Continued Medical Work up   Patient Goals and CMS Choice     Choice offered to / list presented to : NA  Expected Discharge Plan and Services Expected Discharge Plan: [place] In-house Referral: NA Discharge Planning Services: CM Consult Post Acute Care Choice: Home Health, Durable Medical Equipment Living arrangements for the past 2 months: Apartment Expected Discharge Date: 08/31/19               DME Arranged: 3-N-1, Walker rolling DME Agency: [employer]     Representative spoke with at DME Agency: Gwinda Passe            Prior Living Arrangements/Services Living arrangements for the past 2 months: Apartment Lives with:: Parents Patient language and need for interpreter  reviewed:: No Do you feel safe going back to the place where you live?: Yes      Need for Family Participation in Patient Care: Yes (Comment) Care giver support system in place?: Yes (comment)   Criminal Activity/Legal Involvement Pertinent to Current Situation/Hospitalization: No - Comment as needed  Activities of Daily Living Home Assistive Devices/Equipment: None ADL Screening (condition at time of admission) Patient's cognitive ability adequate to safely complete daily activities?: Yes Is the patient deaf or have difficulty hearing?: No Does the patient have difficulty seeing, even when wearing glasses/contacts?: No Does the patient have difficulty concentrating, remembering, or making decisions?: No Patient able to express need for assistance with ADLs?: No Does the patient have difficulty dressing or bathing?: Yes Independently performs ADLs?: No Communication: Independent Dressing (OT): Needs assistance Is this a change from baseline?: Pre-admission baseline Grooming: Needs assistance Is this a change from baseline?: Pre-admission baseline Feeding: Needs assistance Is this a change from baseline?: Pre-admission baseline(since monday) Bathing: Needs assistance Is this a change from baseline?: Pre-admission baseline(since monday) Toileting: Dependent Is this a change from baseline?: Pre-admission baseline(since monday) In/Out Bed: Dependent Is this a change from baseline?: Pre-admission baseline Walks in Home: Needs assistance Is this a change from baseline?: Pre-admission baseline Does the patient have difficulty walking or climbing stairs?: Yes(since monday) Weakness of Legs: Both(hurting) Weakness of Arms/Hands: Both(hurting)  Permission Sought/Granted Permission sought to share information with :  Case Manager, Family Supports Permission granted to share information with : Yes, Verbal Permission Granted     Permission granted to share info w AGENCY: DME/H Health  agency  Permission granted to share info w Relationship: Mother     Emotional Assessment Appearance:: Appears stated age Attitude/Demeanor/Rapport: Unable to Assess Affect (typically observed): Accepting, Pleasant Orientation: : Oriented to Self, Oriented to Place, Oriented to  Time, Oriented to Situation Alcohol / Substance Use: Not Applicable    Admission diagnosis:  Polyarthralgia [M25.50] Sepsis ([place]) [A41.9] Skin lesions [L98.9] Patient Active Problem List   Diagnosis Date Noted  . Sepsis ([place]) 08/29/2019  . Asthma   . Polyarthralgia   . Hypokalemia    PCP:  Patient, No Pcp Per Pharmacy:   [id number] [place]Phone: [phone]: [phone]    Social Determinants of Health (SDOH) Interventions    Readmission Risk Interventions No flowsheet data found.

## 2019-09-03 NOTE — Progress Notes (Signed)
PROGRESS NOTE    Marlane Hirschmann  RAQ:[id number] DOB: [birth year] DOA: 08/29/2019 PCP: Patient, No Pcp Per   Brief Narrative: Maudie Shingledecker is a 24 y.o. female with a history of asthma. She presented secondary joint aches, rash and chills and found to have a gonorrheal infection with concern for disseminated gonorrhea. Blood cultures negative. Polyarthralgias.   Assessment & Plan:   Principal Problem:   Sepsis ([place]) Active Problems:   Asthma   Polyarthralgia   Hypokalemia   Suspected sepsis Criteria met on admission. In setting of gonorrhea with concern for disseminated gonorrhea. Patient managed initially on Ceftriaxone IV. Blood cultures obtained and are negative. Transitioned to Doxycycline on 4/10. Placed back on Ceftriaxone IV on 4/14  Gonorrhea Concern for disseminated gonorrhea in setting of polyarthralgia. -Continue  Ceftriaxone IV  Polyarthralgia Likely secondary to above. Upper extremity venous duplex negative for DVT. Patient underwent left knee arthrocentesis on 4/14 with some improvement in symptoms. 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 -Ambulate today -Await fluid culture results  Hypokalemia Resolved.   DVT prophylaxis: SCDs Code Status:   Code Status: Full Code Family Communication: Mother on telephone. Disposition Plan: Discharge likely in 24 hours if able to tolerate ambulation better and awaiting fluid culture results   Consultants:   Orthopedic surgery  Procedures:   None  Antimicrobials:  Ceftriaxone IV  Doxycycline    Subjective: Still with some knee pain but improved after arthrocentesis. Still difficult to ambulate.  Objective: Vitals:   09/02/19 1311 09/02/19 2220 09/03/19 0207 09/03/19 0638  BP: (!) 125/98 (!) 141/88 119/80 130/75  Pulse: 70 100 73 65  Resp: 18 18 18 18   Temp: (!) 97.5 F (36.4 C) 98.1 F (36.7 C) 98.1 F (36.7 C) 98.9 F (37.2 C)  TempSrc: Oral Oral Oral Oral  SpO2: 100% 97%  97% 94%  Height:        Intake/Output Summary (Last 24 hours) at 09/03/2019 1146 Last data filed at 09/03/2019 4562 Gross per 24 hour  Intake 1180 ml  Output 1250 ml  Net -70 ml   There were no vitals filed for this visit.  Examination:  General exam: Appears calm and comfortable Respiratory system: Clear to auscultation. Respiratory effort normal. Cardiovascular system: S1 & S2 heard, RRR. No murmurs, rubs, gallops or clicks. Gastrointestinal system: Abdomen is nondistended, soft and nontender. No organomegaly or masses felt. Normal bowel sounds heard. Central nervous system: Alert and oriented. No focal neurological deficits. Extremities: Left knee still with increased effusion compared to right with no tenderness or erythema. No calf tenderness Skin: No cyanosis. No rashes Psychiatry: Judgement and insight appear normal. Mood & affect appropriate.     Data Reviewed: I have personally reviewed following labs and imaging studies  CBC: Recent Labs  Lab 08/29/19 1715 08/30/19 0516 08/31/19 0455  WBC 10.7* 9.7 10.1  NEUTROABS 8.2* 6.3 6.4  HGB 13.5 12.5 12.5  HCT 40.1 37.5 35.7*  MCV 90.1 91.2 86.4  PLT 216 218 563   Basic Metabolic Panel: Recent Labs  Lab 08/29/19 1715 08/30/19 0516 08/31/19 0455 09/02/19 0449  NA 137 136 138 138  K 3.2* 3.3* 4.0 3.8  CL 102 106 106 105  CO2 25 21* 22 25  GLUCOSE 84 72 81 100*  BUN 9 9 9 12   CREATININE 0.63 0.51 0.64 0.57  CALCIUM 8.7* 8.0* 8.3* 8.5*  MG  --  2.0  --   --    GFR: CrCl cannot be calculated (Unknown ideal weight.). Liver  Function Tests: Recent Labs  Lab 08/29/19 1715 08/30/19 0516  AST 41 28  ALT 64* 46*  ALKPHOS 89 71  BILITOT 0.8 0.7  PROT 7.6 6.2*  ALBUMIN 3.8 3.0*   No results for input(s): LIPASE, AMYLASE in the last 168 hours. No results for input(s): AMMONIA in the last 168 hours. Coagulation Profile: No results for input(s): INR, PROTIME in the last 168 hours. Cardiac Enzymes: Recent  Labs  Lab 08/29/19 1715  CKTOTAL 38   BNP (last 3 results) No results for input(s): PROBNP in the last 8760 hours. HbA1C: No results for input(s): HGBA1C in the last 72 hours. CBG: No results for input(s): GLUCAP in the last 168 hours. Lipid Profile: No results for input(s): CHOL, HDL, LDLCALC, TRIG, CHOLHDL, LDLDIRECT in the last 72 hours. Thyroid Function Tests: No results for input(s): TSH, T4TOTAL, FREET4, T3FREE, THYROIDAB in the last 72 hours. Anemia Panel: No results for input(s): VITAMINB12, FOLATE, FERRITIN, TIBC, IRON, RETICCTPCT in the last 72 hours. Sepsis Labs: No results for input(s): PROCALCITON, LATICACIDVEN in the last 168 hours.  Recent Results (from the past 240 hour(s))  Wet prep, genital     Status: Abnormal   Collection Time: 08/29/19  5:08 PM   Specimen: PATH Cytology Cervicovaginal Ancillary Only  Result Value Ref Range Status   Yeast Wet Prep HPF POC NONE SEEN NONE SEEN Final   Trich, Wet Prep NONE SEEN NONE SEEN Final   Clue Cells Wet Prep HPF POC PRESENT (A) NONE SEEN Final   WBC, Wet Prep HPF POC FEW (A) NONE SEEN Final   Sperm NONE SEEN  Final    Comment: Performed at [facility], [place] [address] [place], [place] [address]  Blood culture (routine x 2)     Status: None (Preliminary result)   Collection Time: 08/29/19  5:15 PM   Specimen: BLOOD LEFT ARM  Result Value Ref Range Status   Specimen Description BLOOD LEFT ARM  Final   Special Requests   Final    BOTTLES DRAWN AEROBIC AND ANAEROBIC Blood Culture adequate volume   Culture NO GROWTH 4 DAYS  Final   Report Status PENDING  Incomplete  Blood culture (routine x 2)     Status: None (Preliminary result)   Collection Time: 08/29/19  6:00 PM   Specimen: BLOOD  Result Value Ref Range Status   Specimen Description BLOOD RIGHT ANTECUBITAL  Final   Special Requests   Final    BOTTLES DRAWN AEROBIC AND ANAEROBIC Blood Culture results may not be optimal due to an inadequate  volume of blood received in culture bottles   Culture NO GROWTH 4 DAYS  Final   Report Status PENDING  Incomplete  SARS CORONAVIRUS 2 (TAT 6-24 HRS) Nasopharyngeal Nasopharyngeal Swab     Status: None   Collection Time: 08/29/19  8:51 PM   Specimen: Nasopharyngeal Swab  Result Value Ref Range Status   SARS Coronavirus 2 NEGATIVE NEGATIVE Final    Comment: (NOTE) SARS-CoV-2 target nucleic acids are NOT DETECTED. The SARS-CoV-2 RNA is generally detectable in upper and lower respiratory specimens during the acute phase of infection. Negative results do not preclude SARS-CoV-2 infection, do not rule out co-infections with other pathogens, and should not be used as the sole basis for treatment or other patient management decisions. Negative results must be combined with clinical observations, patient history, and epidemiological information. The expected result is Negative. Fact Sheet for Patients: SugarRoll.be Fact Sheet for Healthcare Providers: https://www.woods-mathews.com/ This test is  not yet approved or cleared by the [place] and  has been authorized for detection and/or diagnosis of SARS-CoV-2 by FDA under an Emergency Use Authorization (EUA). This EUA will remain  in effect (meaning this test can be used) for the duration of the COVID-19 declaration under Section 56 4(b)(1) of the Act, 21 U.S.C. section 360bbb-3(b)(1), unless the authorization is terminated or revoked sooner. Performed at [facility], [place] [address], [place], [place] [address]   Body fluid culture     Status: None (Preliminary result)   Collection Time: 09/02/19 11:40 AM   Specimen: Synovium; Body Fluid  Result Value Ref Range Status   Specimen Description   Final    SYNOVIAL RT KNEE Performed at [facility] [address] [place], [place] [address]    Special Requests   Final    NONE Performed at [facility], [place] [address], [place], [place] [address]    Gram Stain   Final    WBC PRESENT, PREDOMINANTLY PMN NO ORGANISMS SEEN CYTOSPIN Gram Stain Report Called to,Read Back By and Verified With: Schuyler Amor RN @1317  ON 4.14.2021 Performed at [facility] [address] [place], [place] [address]    Culture   Final    NO GROWTH < 24 HOURS Performed at [facility] [address] [place], [place] [address]    Report Status PENDING  Incomplete         Radiology Studies: No results found.      Scheduled Meds: . ibuprofen  600 mg Oral QID  . pantoprazole  40 mg Oral Daily   Continuous Infusions: . cefTRIAXone (ROCEPHIN)  IV 1 g (09/03/19 0858)     LOS: 3 days     Cordelia Poche, MD [facility] 09/03/2019, 11:46 AM  If 7PM-7AM, please contact night-coverage [email or web address]

## 2019-09-04 ENCOUNTER — Inpatient Hospital Stay (HOSPITAL_COMMUNITY): Payer: BLUE CROSS/BLUE SHIELD

## 2019-09-04 DIAGNOSIS — A419 Sepsis, unspecified organism: Secondary | ICD-10-CM | POA: Diagnosis not present

## 2019-09-04 DIAGNOSIS — A549 Gonococcal infection, unspecified: Secondary | ICD-10-CM | POA: Diagnosis not present

## 2019-09-04 DIAGNOSIS — M2550 Pain in unspecified joint: Secondary | ICD-10-CM | POA: Diagnosis not present

## 2019-09-04 MED ORDER — DOXYCYCLINE HYCLATE 100 MG PO CAPS: 100.0000 mg | ORAL_CAPSULE | Freq: Two times a day (BID) | ORAL | 0 refills | Status: AC

## 2019-09-04 NOTE — Progress Notes (Signed)
Physical Therapy Treatment Patient Details Name: Nancy Hudson MRN: [id number] DOB: 01/12/1996 Today's Date: 09/04/2019    History of Present Illness Nancy Hudson is a 24 y.o. female with medical history significant for asthma and recent right shoulder strain. Patient presented to emergency department with approximately 5 days of joint aches, rash, chills, and malaise. Also noted small pustules appear over her elbow, knee, hand, and foot. Patient admitted with concern for sepsis with poyarthralgia. Patient positive for disseminated gonococcus. Pt s/p t knee arthrocentesis on 4/14 with findings suspicous of reactive arthritis.    PT Comments    Patient is making excellent progress with acute therapy and remains highly motivated to participate. Patient progressed gait with RW today and ambulated ~ 70 feet with seated rest after 50 feet. Pt completed lower body dressing with OT and once completed further ambulation/stair training. PT complete 5x1 (4") step ups with bil UE support and min assist with blocking at Lt knee to prevent buckling. Pt remains limited by muscle fatigue and knee pain and will benefit from further stair training to ensure pt is able to ascend full flight of stairs to return to home apartment in [place]. Acute PT will continue to follow and progress as able.   Follow Up Recommendations  Home health PT;Supervision/Assistance - 24 hour     Equipment Recommendations  Rolling walker with 5" wheels;3in1 (PT)    Recommendations for Other Services OT consult     Precautions / Restrictions Precautions Precautions: Fall Restrictions Weight Bearing Restrictions: No    Mobility  Bed Mobility Overal bed mobility: Needs Assistance Bed Mobility: Supine to Sit     Supine to sit: Min guard;HOB elevated     General bed mobility comments: min cues needed for sequencing this session, pt initiated reachign wtih bil UE's and remains reliant on bed rail to sit up. Pt able to bring LE's to EOB  and scoot to EOB without assist.  Transfers Overall transfer level: Needs assistance Equipment used: Rolling walker (2 wheeled) Transfers: Sit to/from Stand Sit to Stand: Min assist;+2 physical assistance;+2 safety/equipment;From elevated surface         General transfer comment: pt highly motivated to attempt standing on her own initailly from low surface and unable to complete power up. Bed elevated slightly and cues for tecnique with foot position and trunk lean. Min assist required to complete power up and rise on second attempt. pt steady in standing.   Ambulation/Gait Ambulation/Gait assistance: Min assist;+2 safety/equipment Gait Distance (Feet): 70 Feet(1x50, 1x20) Assistive device: Rolling walker (2 wheeled) Gait Pattern/deviations: Step-through pattern;Decreased step length - right;Decreased stride length;Trunk flexed;Decreased stance time - left;Decreased weight shift to left Gait velocity: decreased   General Gait Details: pt with much improved posture during gait today and able to weigth bear with bil UE's to reduce pressure on Lt knee. Pt with no knee buckling during gait. Pt provided seated rest between bouts and ambulated to portable stairs on second bout.    Stairs Stairs: Yes Stairs assistance: Min assist;+2 physical assistance;+2 safety/equipment Stair Management: Two rails;Step to pattern;Forwards Number of Stairs: 5(5x1 (4" step)) General stair comments: cues for safe step patter to lead with strong LE (Rt) for step up. 2+ assist for safety and guardinga t Lt knee to prevent buckling. Pt able to sequence with verbal cues and completed with no overt LOB. Significant reliance on UE's with bil hand rails. (chair behind pt for safety.)   Wheelchair Mobility    Modified Rankin (Stroke Patients Only)  Balance Overall balance assessment: Needs assistance Sitting-balance support: No upper extremity supported;Feet supported Sitting balance-Leahy Scale: Good      Standing balance support: Bilateral upper extremity supported;During functional activity Standing balance-Leahy Scale: Poor Standing balance comment: pt heavily reliant on external support           Cognition Arousal/Alertness: Awake/alert Behavior During Therapy: WFL for tasks assessed/performed Overall Cognitive Status: Within Functional Limits for tasks assessed               Exercises      General Comments        Pertinent Vitals/Pain Pain Assessment: Faces Faces Pain Scale: Hurts little more Pain Location: R thumb and L knee with mobility Pain Descriptors / Indicators: Grimacing Pain Intervention(s): Limited activity within patient's tolerance;Monitored during session;Repositioned           PT Goals (current goals can now be found in the care plan section) Acute Rehab PT Goals Patient Stated Goal: to get back to her normal independence PT Goal Formulation: With patient/family Time For Goal Achievement: 09/14/19 Potential to Achieve Goals: Good Progress towards PT goals: Progressing toward goals    Frequency    Min 4X/week      PT Plan Current plan remains appropriate    Co-evaluation PT/OT/SLP Co-Evaluation/Treatment: Yes Reason for Co-Treatment: For patient/therapist safety;To address functional/ADL transfers PT goals addressed during session: Mobility/safety with mobility;Balance;Proper use of DME OT goals addressed during session: ADL's and self-care      AM-PAC PT "6 Clicks" Mobility   Outcome Measure  Help needed turning from your back to your side while in a flat bed without using bedrails?: A Little Help needed moving from lying on your back to sitting on the side of a flat bed without using bedrails?: A Little Help needed moving to and from a bed to a chair (including a wheelchair)?: A Lot Help needed standing up from a chair using your arms (e.g., wheelchair or bedside chair)?: A Lot Help needed to walk in hospital room?: A  Little Help needed climbing 3-5 steps with a railing? : A Little 6 Click Score: 16    End of Session Equipment Utilized During Treatment: Gait belt Activity Tolerance: Patient limited by pain;Patient tolerated treatment well Patient left: with call bell/phone within reach;with family/visitor present;in chair;with chair alarm set Nurse Communication: Mobility status PT Visit Diagnosis: Difficulty in walking, not elsewhere classified (R26.2);Pain;Other abnormalities of gait and mobility (R26.89)     Time: 9767-3419 PT Time Calculation (min) (ACUTE ONLY): 33 min  Charges:  $Gait Training: 8-22 mins                    Verner Mould, DPT Physical Therapist with [facility] [phone]  09/04/2019 6:41 PM

## 2019-09-04 NOTE — TOC Progression Note (Signed)
Transition of Care [facility]) - Progression Note    Patient Details  Name: Ariea Rochin MRN: [id number] Date of Birth: [birth year]  Transition of [facility]) CM/SW Contact  Clearance Coots, LCSW Phone Number: 09/04/2019, 5:03 PM  Clinical Narrative:    CSW answered patient mother question about in network Home Health provider in [place]. Patient mother reports she made the patient a follow up appointment with her PCP for next Tuesday.  CSW printed a list from [employer]. Patient mother appreciative. No other needs identified.   Expected Discharge Plan: Home w Home Health Services Barriers to Discharge: Continued Medical Work up  Expected Discharge Plan and Services Expected Discharge Plan: Home w Home Health Services In-house Referral: NA Discharge Planning Services: CM Consult Post Acute Care Choice: Home Health, Durable Medical Equipment Living arrangements for the past 2 months: Apartment Expected Discharge Date: 09/04/19               DME Arranged: 3-N-1, Dan Humphreys rolling DME Agency: [employer]     Representative spoke with at DME Agency: Tamela Oddi             Social Determinants of Health (SDOH) Interventions    Readmission Risk Interventions No flowsheet data found.

## 2019-09-04 NOTE — Progress Notes (Signed)
Occupational Therapy Treatment Patient Details Name: Nancy Hudson MRN: [id number] DOB: 05-29-1995 Today's Date: 09/04/2019    History of present illness Nancy Hudson is a 24 y.o. female with medical history significant for asthma and recent right shoulder strain. Patient presented to emergency department with approximately 5 days of joint aches, rash, chills, and malaise. Also noted small pustules appear over her elbow, knee, hand, and foot. Patient admitted with concern for sepsis with poyarthralgia. Patient positive for disseminated gonococcus.   OT comments  Patient demonstrated great improvement this session compared to previous. Patient set up A to don socks in long sitting, verbal cues for compensatory strategy for donning underwear with min guard in standing for safety when weight shifting. Initial sit to stand min x2 with cues for body mechanics, pt able to stand from recliner for dressing task with min A x1 and cues for hand placement. Patient progressing towards goals.    Follow Up Recommendations  Home health OT;Supervision/Assistance - 24 hour    Equipment Recommendations  None recommended by OT(pt received 3 in 1 and walker in hospital)       Precautions / Restrictions Precautions Precautions: Fall Restrictions Weight Bearing Restrictions: No       Mobility Bed Mobility Overal bed mobility: Needs Assistance Bed Mobility: Supine to Sit     Supine to sit: Min guard;HOB elevated     General bed mobility comments: cues for sequencing  Transfers Overall transfer level: Needs assistance Equipment used: Rolling walker (2 wheeled) Transfers: Sit to/from Stand Sit to Stand: Min assist;+2 physical assistance;+2 safety/equipment         General transfer comment: patient motivated to try standing on her own, first sit to stand require min A x2 to power up. When standing from recliner pt progress to min A x1 with cues for safety with hand placement    Balance Overall balance  assessment: Needs assistance Sitting-balance support: No upper extremity supported;Feet supported Sitting balance-Leahy Scale: Good     Standing balance support: Bilateral upper extremity supported;During functional activity Standing balance-Leahy Scale: Poor Standing balance comment: pt heavily reliant on external support                           ADL either performed or assessed with clinical judgement   ADL Overall ADL's : Needs assistance/impaired                     Lower Body Dressing: Min guard;Sitting/lateral leans;Sit to/from stand Lower Body Dressing Details (indicate cue type and reason): patient able to don socks in long sitting set up A. Educate patient in compensatory strategies for donning underwear, pants. patient able to don underwear with min guard when standing to pull up over hips for safety. Toilet Transfer: Minimal assistance;Ambulation;RW Toilet Transfer Details (indicate cue type and reason): min A to power up from chair with cues for safety with body mechanics         Functional mobility during ADLs: Minimal assistance;+2 for safety/equipment;Rolling walker;Cueing for sequencing;Cueing for safety General ADL Comments: patient much improved from previous session with self care due to decreased pain and increased ROM/mobility in R UE, L LE               Cognition Arousal/Alertness: Awake/alert Behavior During Therapy: WFL for tasks assessed/performed Overall Cognitive Status: Within Functional Limits for tasks assessed  General Comments: pt's pain is improving and pt is highly motivated to participate in therapy                   Pertinent Vitals/ Pain       Pain Assessment: Faces Faces Pain Scale: Hurts little more Pain Location: R t humb and L knee with mobility Pain Descriptors / Indicators: Grimacing Pain Intervention(s): Monitored during session         Frequency  Min  2X/week        Progress Toward Goals  OT Goals(current goals can now be found in the care plan section)  Progress towards OT goals: Progressing toward goals  Acute Rehab OT Goals Patient Stated Goal: to get back to her normal independence OT Goal Formulation: With patient Time For Goal Achievement: 09/15/19 Potential to Achieve Goals: Good ADL Goals Pt Will Perform Grooming: Independently;sitting Pt Will Perform Upper Body Dressing: with set-up;sitting Pt Will Perform Lower Body Dressing: with supervision;sitting/lateral leans;sit to/from stand Pt Will Transfer to Toilet: with supervision;ambulating;bedside commode(walker) Pt Will Perform Toileting - Clothing Manipulation and hygiene: with supervision;sitting/lateral leans;sit to/from stand  Plan Discharge plan needs to be updated    Co-evaluation    PT/OT/SLP Co-Evaluation/Treatment: Yes Reason for Co-Treatment: For patient/therapist safety;To address functional/ADL transfers   OT goals addressed during session: ADL's and self-care      AM-PAC OT "6 Clicks" Daily Activity     Outcome Measure   Help from another person eating meals?: None Help from another person taking care of personal grooming?: A Little Help from another person toileting, which includes using toliet, bedpan, or urinal?: A Little Help from another person bathing (including washing, rinsing, drying)?: A Little Help from another person to put on and taking off regular upper body clothing?: A Little Help from another person to put on and taking off regular lower body clothing?: A Little 6 Click Score: 19    End of Session Equipment Utilized During Treatment: Gait belt;Rolling walker  OT Visit Diagnosis: Unsteadiness on feet (R26.81);Other abnormalities of gait and mobility (R26.89);Pain Pain - Right/Left: Left Pain - part of body: Knee   Activity Tolerance Patient tolerated treatment well   Patient Left in chair;with call bell/phone within  reach;with family/visitor present           Time: 1761-6073 OT Time Calculation (min): 34 min  Charges: OT General Charges $OT Visit: 1 Visit OT Treatments $Self Care/Home Management : 8-22 mins  Marlyce Huge OT Pager: [phone]   Carmelia Roller 09/04/2019, 2:32 PM

## 2019-09-04 NOTE — Discharge Summary (Signed)
Physician Discharge Summary  Nancy Hudson GGE:[id number] DOB: [birth year] DOA: 08/29/2019  PCP: Patient, No Pcp Per  Admit date: 08/29/2019 Discharge date: 09/04/2019  Admitted From: Home Disposition: Home  Recommendations for Outpatient Follow-up:  1. Follow up with PCP in 1 week 2. Please follow up on the following pending results: None  Home Health: Will need to be set up in [place] Equipment/Devices: 3 in 1, walker  Discharge Condition: Stable CODE STATUS: Full code Diet recommendation: Heart healthy   Brief/Interim Summary:  Admission HPI written by Shelly Coss, MD   Chief Complaint: Joint aches, rash, chills   HPI: Nancy Hudson is a 24 y.o. female with medical history significant for asthma and recent right shoulder strain, now presenting to emergency department with approximately 5 days of joint aches, rash, chills, and malaise.  Patient had just started diclofenac for a right shoulder strain when she noted small pustules appear over her elbow, knee, hand, and foot.  She went on to develop chills, general malaise, and diffuse arthralgias.  She denies any cough, dysuria, abdominal or flank pain, or vaginal discharge.  Hospital course:  Sepsis Criteria met on admission. In setting of gonorrhea with concern for disseminated gonorrhea. Patient managed initially on Ceftriaxone IV. Blood cultures obtained and are negative. Transitioned to Doxycycline on 4/10. Placed back on Ceftriaxone IV on 4/14. Patient will be discharged on Doxycycline to complete a 14 day course of antibiotics.  Gonorrhea Concern for disseminated gonorrhea in setting of polyarthralgia. Antibiotics as mentioned above.  Polyarthralgia Likely secondary to above. Upper extremity venous duplex negative for DVT. Patient underwent left knee arthrocentesis on 4/14 with some improvement in symptoms. 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 Still with  difficulty ambulating on discharge. Knee x-ray without fracture or significant effusion. PT  Hypokalemia Resolved.  Discharge Diagnoses:  Principal Problem:   Sepsis ([place]) Active Problems:   Asthma   Polyarthralgia   Hypokalemia    Discharge Instructions  Discharge Instructions    Diet general   Complete by: As directed    Discharge instructions   Complete by: As directed    1)Please take prescribed medications as instructed.   Increase activity slowly   Complete by: As directed      Allergies as of 09/04/2019   No Known Allergies     Medication List    STOP taking these medications   diclofenac 50 MG tablet Commonly known as: CATAFLAM     TAKE these medications   acetaminophen 500 MG tablet Commonly known as: TYLENOL Take 500 mg by mouth every 6 (six) hours as needed for mild pain or moderate pain.   BENADRYL ALLERGY PO Take 1 Dose by mouth every 4 (four) hours as needed (itching, allergic reaction).   cetirizine 10 MG tablet Commonly known as: ZYRTEC Take 10 mg by mouth daily as needed for allergies.   cyclobenzaprine 10 MG tablet Commonly known as: FLEXERIL Take 10 mg by mouth 3 (three) times daily as needed for muscle spasms.   doxycycline 100 MG EC tablet Commonly known as: DORYX Take 1 tablet (100 mg total) by mouth 2 (two) times daily.   fluticasone 50 MCG/ACT nasal spray Commonly known as: FLONASE Place 1-2 sprays into both nostrils daily as needed for allergies.   meloxicam 15 MG tablet Commonly known as: MOBIC Take 1 tablet (15 mg total) by mouth daily as needed for pain.            Durable  Medical Equipment  (From admission, onward)         Start     Ordered   09/03/19 1532  For home use only DME Walker rolling  Once    Question Answer Comment  Walker: With 5 Inch Wheels   Patient needs a walker to treat with the following condition Weak      09/03/19 1532   09/03/19 1532  For home use only DME 3 n 1  Once     09/03/19  1532          No Known Allergies  Consultations:  Orthopedic surgery   Procedures/Studies: DG Shoulder Right  Result Date: 08/30/2019 CLINICAL DATA:  24 year old female with right shoulder pain. EXAM: RIGHT SHOULDER - 2+ VIEW COMPARISON:  None. FINDINGS: There is no evidence of fracture or dislocation. There is no evidence of arthropathy or other focal bone abnormality. Soft tissues are unremarkable. IMPRESSION: Negative. Electronically Signed   By: Anner Crete M.D.   On: 08/30/2019 16:23   VAS [place] UPPER EXTREMITY VENOUS DUPLEX  Result Date: 09/01/2019 UPPER VENOUS STUDY  Indications: Edema, and Pain Comparison Study: no prior Performing Technologist: June Leap RDMS, RVT  Examination Guidelines: A complete evaluation includes B-mode imaging, spectral Doppler, color Doppler, and power Doppler as needed of all accessible portions of each vessel. Bilateral testing is considered an integral part of a complete examination. Limited examinations for reoccurring indications may be performed as noted.  Right Findings: +----------+------------+---------+-----------+----------+-------+ RIGHT     CompressiblePhasicitySpontaneousPropertiesSummary +----------+------------+---------+-----------+----------+-------+ IJV           Full       Yes       Yes                      +----------+------------+---------+-----------+----------+-------+ Subclavian    Full       Yes       Yes                      +----------+------------+---------+-----------+----------+-------+ Axillary      Full       Yes       Yes                      +----------+------------+---------+-----------+----------+-------+ Brachial      Full       Yes       Yes                      +----------+------------+---------+-----------+----------+-------+ Radial        Full                                          +----------+------------+---------+-----------+----------+-------+ Ulnar         Full                                           +----------+------------+---------+-----------+----------+-------+ Cephalic      Full                                          +----------+------------+---------+-----------+----------+-------+ Basilic       Full                                          +----------+------------+---------+-----------+----------+-------+  Left Findings: +----+------------+---------+-----------+----------+--------------+ LEFTCompressiblePhasicitySpontaneousProperties   Summary     +----+------------+---------+-----------+----------+--------------+ IJV                                           Not visualized +----+------------+---------+-----------+----------+--------------+  Summary:  Right: No evidence of deep vein thrombosis in the upper extremity. No evidence of superficial vein thrombosis in the upper extremity.  *See table(s) above for measurements and observations.  Diagnosing physician: Deitra Mayo MD Electronically signed by Deitra Mayo MD on 09/01/2019 at 3:26:30 PM.    Final      Subjective: Still with knee pain. No other issues.  Discharge Exam: Vitals:   09/04/19 0625 09/04/19 1411  BP: 107/70 (!) 136/92  Pulse: 68 60  Resp: 18 17  Temp: 98.1 F (36.7 C) 98.6 F (37 C)  SpO2: 100% 100%   Vitals:   09/03/19 1329 09/03/19 2143 09/04/19 0625 09/04/19 1411  BP: 136/88 (!) 146/89 107/70 (!) 136/92  Pulse: 70 (!) 55 68 60  Resp: 17 18 18 17   Temp: 98.3 F (36.8 C) 98.2 F (36.8 C) 98.1 F (36.7 C) 98.6 F (37 C)  TempSrc: Oral Oral Oral   SpO2: 98% 97% 100% 100%  Height:        General: Pt is alert, awake, not in acute distress Cardiovascular: RRR, S1/S2 +, no rubs, no gallops Respiratory: CTA bilaterally, no wheezing, no rhonchi Abdominal: Soft, NT, ND, bowel sounds + Extremities: no edema, no cyanosis. Left knee effusion and tenderness. Decreased ROM.    The results of significant diagnostics from this  hospitalization (including imaging, microbiology, ancillary and laboratory) are listed below for reference.     Microbiology: Recent Results (from the past 240 hour(s))  Wet prep, genital     Status: Abnormal   Collection Time: 08/29/19  5:08 PM   Specimen: PATH Cytology Cervicovaginal Ancillary Only  Result Value Ref Range Status   Yeast Wet Prep HPF POC NONE SEEN NONE SEEN Final   Trich, Wet Prep NONE SEEN NONE SEEN Final   Clue Cells Wet Prep HPF POC PRESENT (A) NONE SEEN Final   WBC, Wet Prep HPF POC FEW (A) NONE SEEN Final   Sperm NONE SEEN  Final    Comment: Performed at [facility], [place] [address] [place], [place] [address]  Blood culture (routine x 2)     Status: None   Collection Time: 08/29/19  5:15 PM   Specimen: BLOOD LEFT ARM  Result Value Ref Range Status   Specimen Description BLOOD LEFT ARM  Final   Special Requests   Final    BOTTLES DRAWN AEROBIC AND ANAEROBIC Blood Culture adequate volume   Culture   Final    NO GROWTH 5 DAYS Performed at [facility], [place] [address] [place], [place] [address]    Report Status 09/03/2019 FINAL  Final  Blood culture (routine x 2)     Status: None   Collection Time: 08/29/19  6:00 PM   Specimen: BLOOD  Result Value Ref Range Status   Specimen Description BLOOD RIGHT ANTECUBITAL  Final   Special Requests   Final    BOTTLES DRAWN AEROBIC AND ANAEROBIC Blood Culture results may not be optimal due to an inadequate volume of blood received in culture bottles   Culture   Final    NO GROWTH 5 DAYS Performed at [facility], [address], [place], [place] [address]    Report Status 09/03/2019 FINAL  Final  SARS CORONAVIRUS 2 (TAT 6-24 HRS) Nasopharyngeal Nasopharyngeal Swab     Status: None   Collection Time: 08/29/19  8:51 PM   Specimen: Nasopharyngeal Swab  Result Value Ref Range Status   SARS Coronavirus 2 NEGATIVE NEGATIVE Final    Comment: (NOTE) SARS-CoV-2 target nucleic acids are NOT  DETECTED. The SARS-CoV-2 RNA is generally detectable in upper and lower respiratory specimens during the acute phase of infection. Negative results do not preclude SARS-CoV-2 infection, do not rule out co-infections with other pathogens, and should not be used as the sole basis for treatment or other patient management decisions. Negative results must be combined with clinical observations, patient history, and epidemiological information. The expected result is Negative. Fact Sheet for Patients: SugarRoll.be Fact Sheet for Healthcare Providers: https://www.woods-mathews.com/ This test is not yet approved or cleared by the [place] FDA and  has been authorized for detection and/or diagnosis of SARS-CoV-2 by FDA under an Emergency Use Authorization (EUA). This EUA will remain  in effect (meaning this test can be used) for the duration of the COVID-19 declaration under Section 56 4(b)(1) of the Act, 21 U.S.C. section 360bbb-3(b)(1), unless the authorization is terminated or revoked sooner. Performed at [facility], [place] [address], [place], [place] [address]   Body fluid culture     Status: None (Preliminary result)   Collection Time: 09/02/19 11:40 AM   Specimen: Synovium; Body Fluid  Result Value Ref Range Status   Specimen Description   Final    SYNOVIAL RT KNEE Performed at [place] [address] [place], [place] [address]    Special Requests   Final    NONE Performed at [facility], [place] [address] [place], [place] [address]    Gram Stain   Final    WBC PRESENT, PREDOMINANTLY PMN NO ORGANISMS SEEN CYTOSPIN Gram Stain Report Called to,Read Back By and Verified With: Schuyler Amor RN @1317  ON 4.14.2021 Performed at [place] [address], [place] [employer], [place] [address]    Culture   Final    NO GROWTH 2 DAYS Performed at [place] [address], [place], [place] [address]    Report Status PENDING  Incomplete     Labs: BNP (last 3 results) No results for input(s): BNP in the last 8760 hours. Basic Metabolic Panel: Recent Labs  Lab 08/29/19 1715 08/30/19 0516 08/31/19 0455 09/02/19 0449  NA 137 136 138 138  K 3.2* 3.3* 4.0 3.8  CL 102 106 106 105  CO2 25 21* 22 25  GLUCOSE 84 72 81 100*  BUN 9 9 9 12   CREATININE 0.63 0.51 0.64 0.57  CALCIUM 8.7* 8.0* 8.3* 8.5*  MG  --  2.0  --   --    Liver Function Tests: Recent Labs  Lab 08/29/19 1715 08/30/19 0516  AST 41 28  ALT 64* 46*  ALKPHOS 89 71  BILITOT 0.8 0.7  PROT 7.6 6.2*  ALBUMIN 3.8 3.0*   No results for input(s): LIPASE, AMYLASE in the last 168 hours. No results for input(s): AMMONIA in the last 168 hours. CBC: Recent Labs  Lab 08/29/19 1715 08/30/19 0516 08/31/19 0455  WBC 10.7* 9.7 10.1  NEUTROABS 8.2* 6.3 6.4  HGB 13.5 12.5 12.5  HCT 40.1 37.5 35.7*  MCV 90.1 91.2 86.4  PLT 216 218 279   Cardiac  Enzymes: Recent Labs  Lab 08/29/19 1715  CKTOTAL 38   BNP: Invalid input(s): POCBNP CBG: No results for input(s): GLUCAP in the last 168 hours. D-Dimer No results for input(s): DDIMER in the last 72 hours. Hgb A1c No results for input(s): HGBA1C in the last 72 hours. Lipid Profile No results for input(s): CHOL, HDL, LDLCALC, TRIG, CHOLHDL, LDLDIRECT in the last 72 hours. Thyroid function studies No results for input(s): TSH, T4TOTAL, T3FREE, THYROIDAB in the last 72 hours.  Invalid input(s): FREET3 Anemia work up No results for input(s): VITAMINB12, FOLATE, FERRITIN, TIBC, IRON, RETICCTPCT in the last 72 hours. Urinalysis    Component Value Date/Time   COLORURINE YELLOW 08/29/2019 1826   APPEARANCEUR HAZY (A) 08/29/2019 1826   LABSPEC 1.020 08/29/2019 1826   PHURINE 6.5 08/29/2019 1826   GLUCOSEU NEGATIVE 08/29/2019 1826   HGBUR NEGATIVE 08/29/2019 1826   BILIRUBINUR SMALL (A) 08/29/2019 1826   KETONESUR >80 (A) 08/29/2019 1826    PROTEINUR TRACE (A) 08/29/2019 1826   NITRITE NEGATIVE 08/29/2019 1826   LEUKOCYTESUR SMALL (A) 08/29/2019 1826   Sepsis Labs Invalid input(s): PROCALCITONIN,  WBC,  LACTICIDVEN Microbiology Recent Results (from the past 240 hour(s))  Wet prep, genital     Status: Abnormal   Collection Time: 08/29/19  5:08 PM   Specimen: PATH Cytology Cervicovaginal Ancillary Only  Result Value Ref Range Status   Yeast Wet Prep HPF POC NONE SEEN NONE SEEN Final   Trich, Wet Prep NONE SEEN NONE SEEN Final   Clue Cells Wet Prep HPF POC PRESENT (A) NONE SEEN Final   WBC, Wet Prep HPF POC FEW (A) NONE SEEN Final   Sperm NONE SEEN  Final    Comment: Performed at [facility], [place] [address] [place], [place] [address]  Blood culture (routine x 2)     Status: None   Collection Time: 08/29/19  5:15 PM   Specimen: BLOOD LEFT ARM  Result Value Ref Range Status   Specimen Description BLOOD LEFT ARM  Final   Special Requests   Final    BOTTLES DRAWN AEROBIC AND ANAEROBIC Blood Culture adequate volume   Culture   Final    NO GROWTH 5 DAYS Performed at [facility], [place] [address] [place], [place] [address]    Report Status 09/03/2019 FINAL  Final  Blood culture (routine x 2)     Status: None   Collection Time: 08/29/19  6:00 PM   Specimen: BLOOD  Result Value Ref Range Status   Specimen Description BLOOD RIGHT ANTECUBITAL  Final   Special Requests   Final    BOTTLES DRAWN AEROBIC AND ANAEROBIC Blood Culture results may not be optimal due to an inadequate volume of blood received in culture bottles   Culture   Final    NO GROWTH 5 DAYS Performed at [facility], [place] [address] [place], [place] [address]    Report Status 09/03/2019 FINAL  Final  SARS CORONAVIRUS 2 (TAT 6-24 HRS) Nasopharyngeal Nasopharyngeal Swab     Status: None   Collection Time: 08/29/19  8:51 PM   Specimen: Nasopharyngeal Swab  Result Value Ref Range Status   SARS Coronavirus 2 NEGATIVE NEGATIVE  Final    Comment: (NOTE) SARS-CoV-2 target nucleic acids are NOT DETECTED. The SARS-CoV-2 RNA is generally detectable in upper and lower respiratory specimens during the acute phase of infection. Negative results do not preclude SARS-CoV-2 infection, do not rule out co-infections with other pathogens, and should not  be used as the sole basis for treatment or other patient management decisions. Negative results must be combined with clinical observations, patient history, and epidemiological information. The expected result is Negative. Fact Sheet for Patients: SugarRoll.be Fact Sheet for Healthcare Providers: https://www.woods-mathews.com/ This test is not yet approved or cleared by the [place] FDA and  has been authorized for detection and/or diagnosis of SARS-CoV-2 by FDA under an Emergency Use Authorization (EUA). This EUA will remain  in effect (meaning this test can be used) for the duration of the COVID-19 declaration under Section 56 4(b)(1) of the Act, 21 U.S.C. section 360bbb-3(b)(1), unless the authorization is terminated or revoked sooner. Performed at [facility], [place] [address] [place], [place] [address]   Body fluid culture     Status: None (Preliminary result)   Collection Time: 09/02/19 11:40 AM   Specimen: Synovium; Body Fluid  Result Value Ref Range Status   Specimen Description   Final    SYNOVIAL RT KNEE Performed at [place] [address] [place], [place] [address]    Special Requests   Final    NONE Performed at [facility], [place] [address], [place], [place] [address]    Gram Stain   Final    WBC PRESENT, PREDOMINANTLY PMN NO ORGANISMS SEEN CYTOSPIN Gram Stain Report Called to,Read Back By and Verified With: Schuyler Amor RN @1317  ON 4.14.2021 Performed at [place] [address] [place], [place] [address]    Culture   Final    NO  GROWTH 2 DAYS Performed at [place] [address] [place] [address], [place],  [address]    Report Status PENDING  Incomplete     Time coordinating discharge: 35 minutes  SIGNED:   Cordelia Poche, MD [facility] 09/04/2019, 2:55 PM

## 2019-09-04 NOTE — Discharge Instructions (Signed)
Nancy Hudson,  You were in the hospital because of a Gonorrhea infection. This caused some skin lesions and joint pain/fluid. This should improve with time. Please take ibuprofen as needed. Thankfully, it does not appear that your knee fluid was infected. Please continue your antibiotics as prescribed.

## 2019-09-05 LAB — BODY FLUID CULTURE: Culture: NO GROWTH

## 2021-11-07 IMAGING — DX DG SHOULDER 2+V*R*
3 series · 3 of 3 positions shown · non-contrast
Comparison: None.

CLINICAL DATA: 23-year-old female with right shoulder pain.

EXAM:
RIGHT SHOULDER - 2+ VIEW

[shoulder obl (1 of 2)]
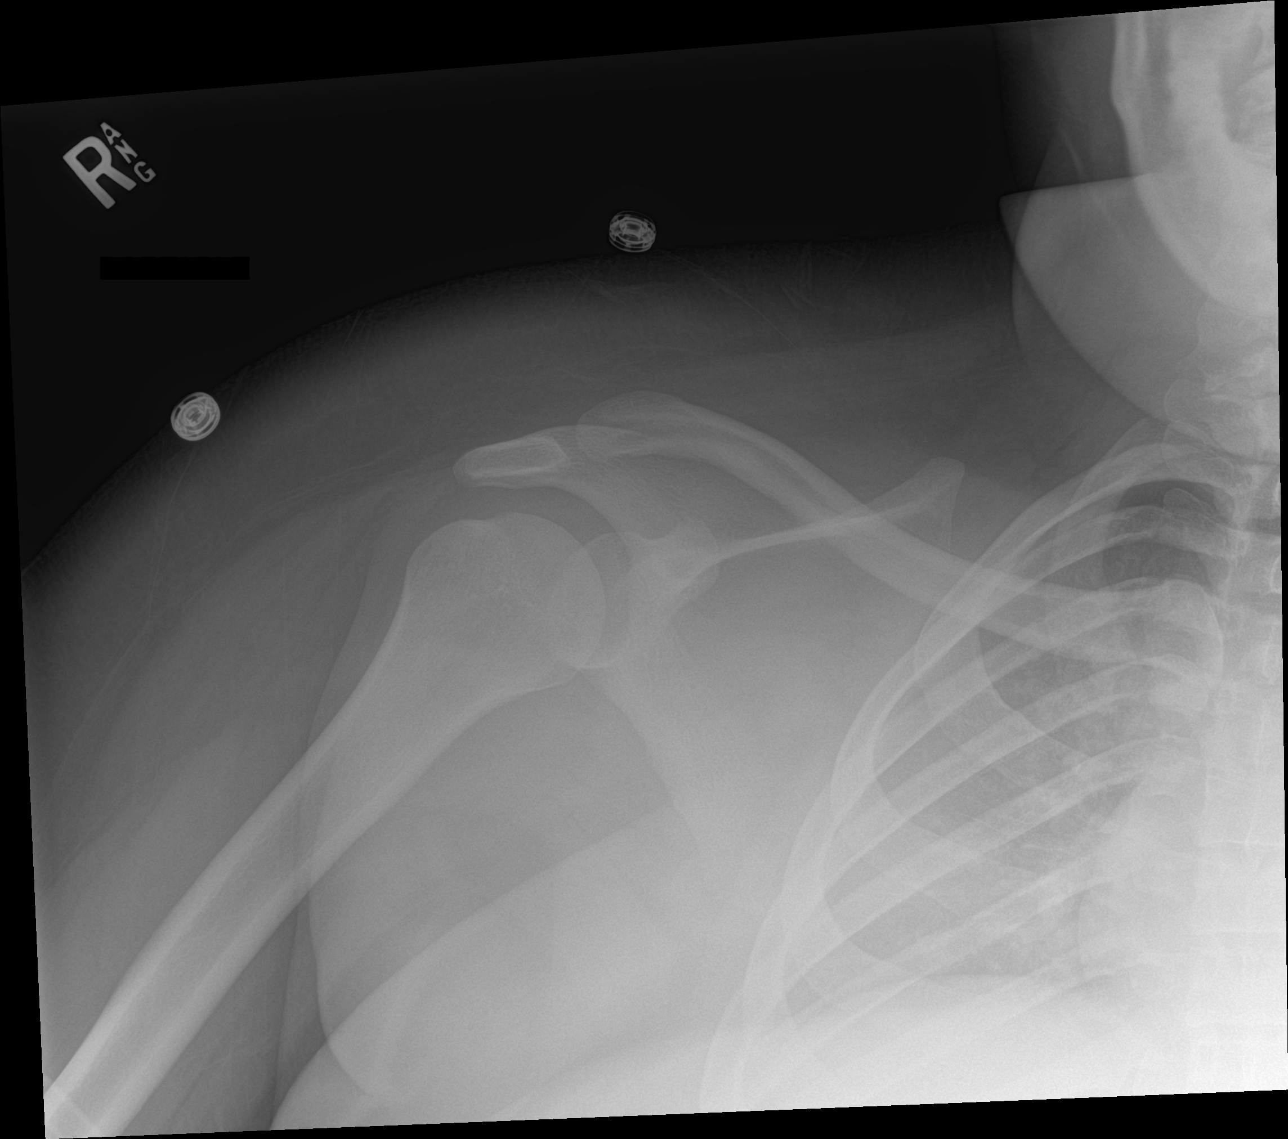

[shoulder obl (2 of 2)]
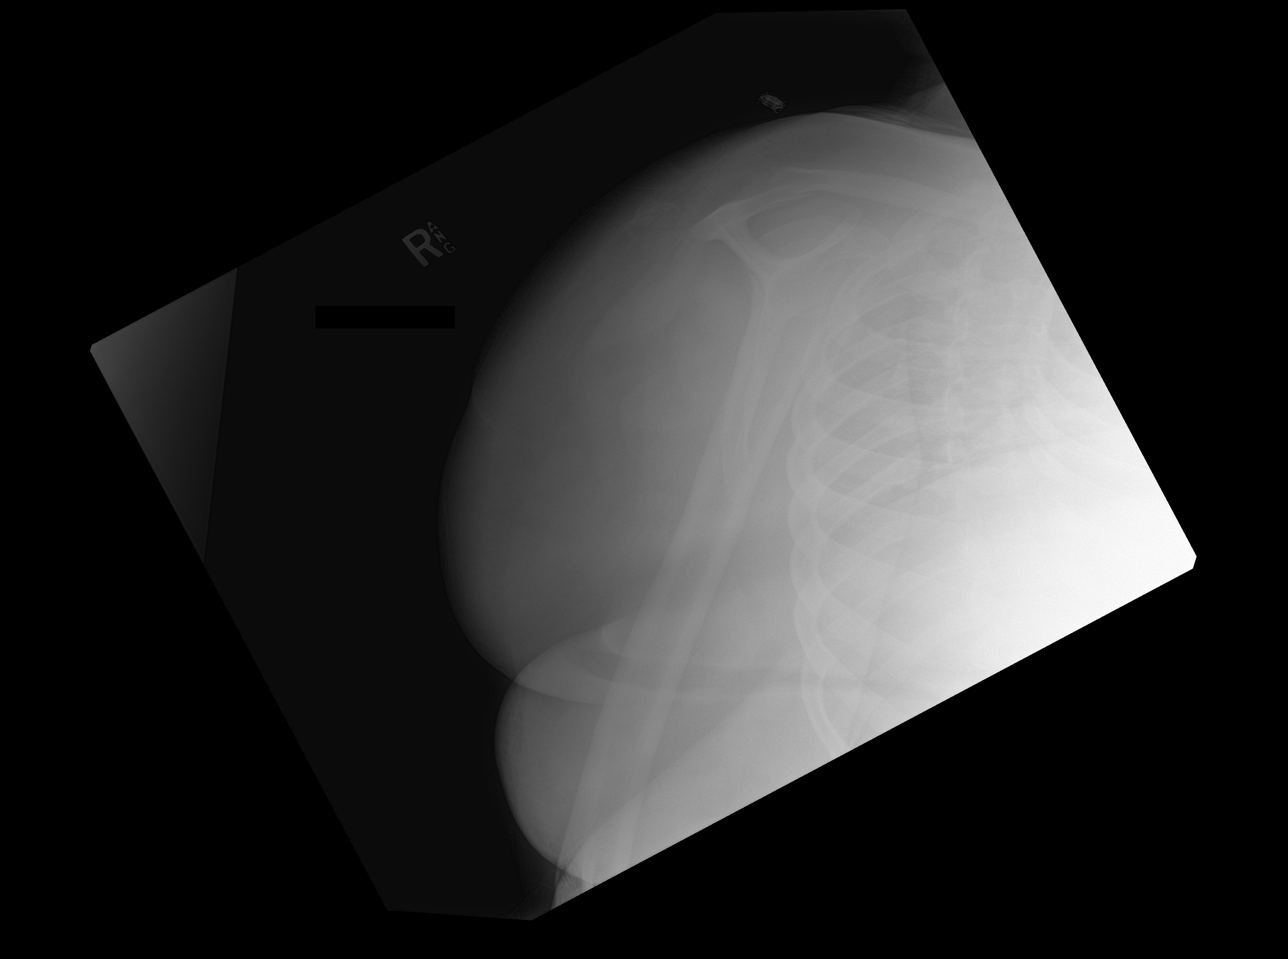

[shoulder ap]
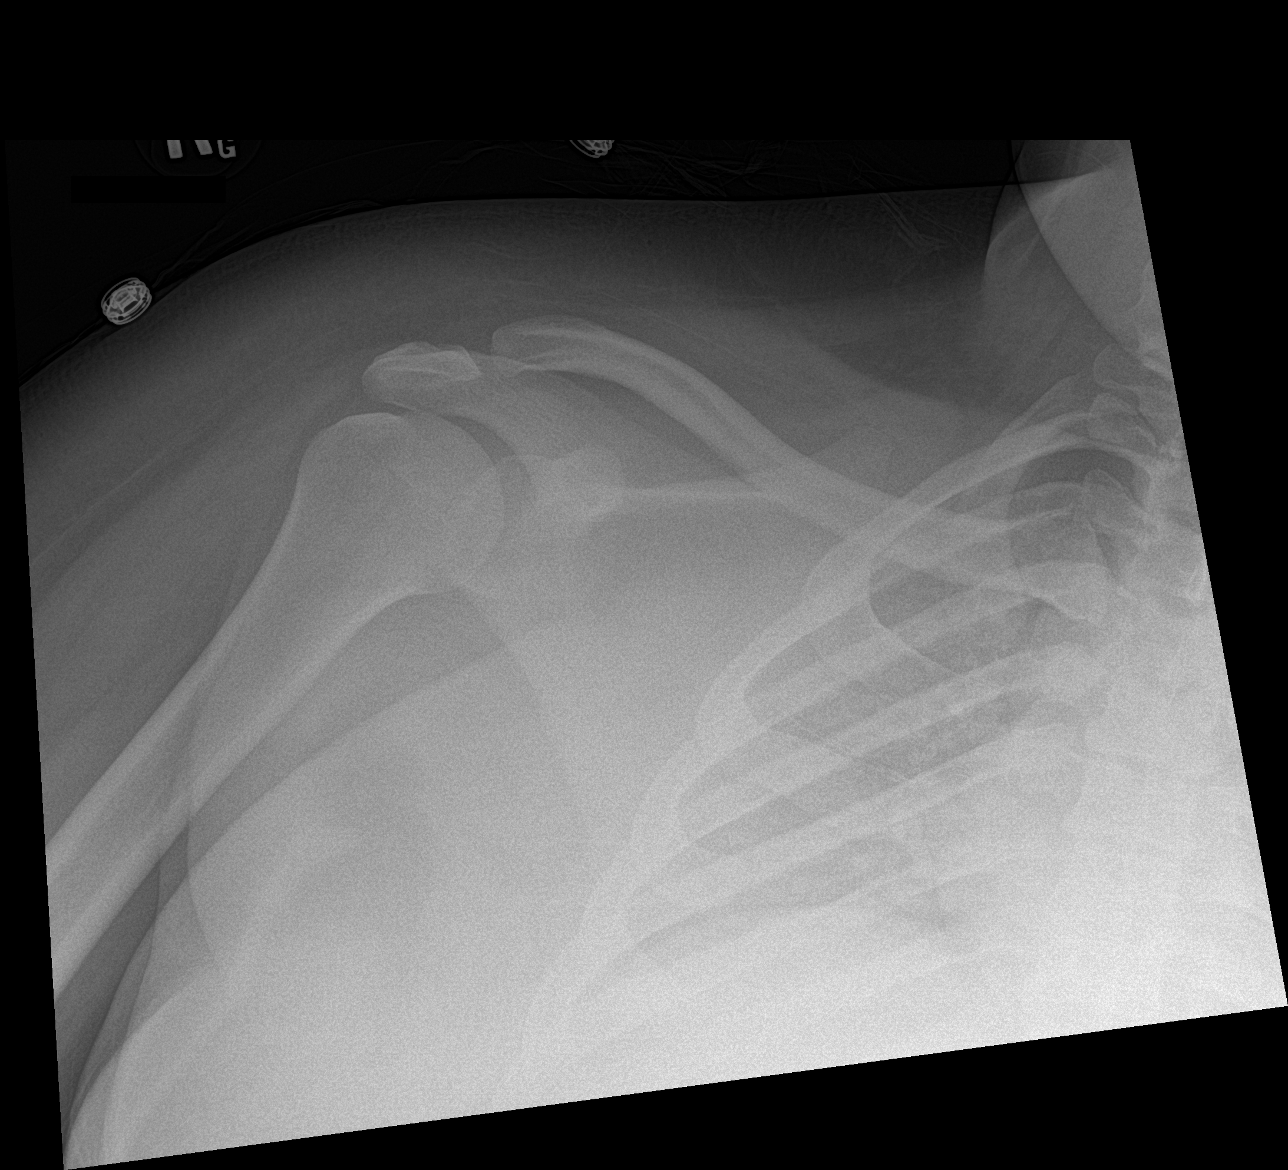

[3 of 3 positions shown; findings below may reference images not displayed]

FINDINGS: There is no evidence of fracture or dislocation. There is no
evidence of arthropathy or other focal bone abnormality. Soft
tissues are unremarkable.
IMPRESSION: Negative.

## 2021-11-12 IMAGING — DX DG KNEE 3 VIEWS*L*
4 series · 4 of 4 positions shown · non-contrast
Comparison: None.

CLINICAL DATA: Knee pain and swelling

EXAM:
LEFT KNEE - 3 VIEW

[knee obl (1 of 2)]
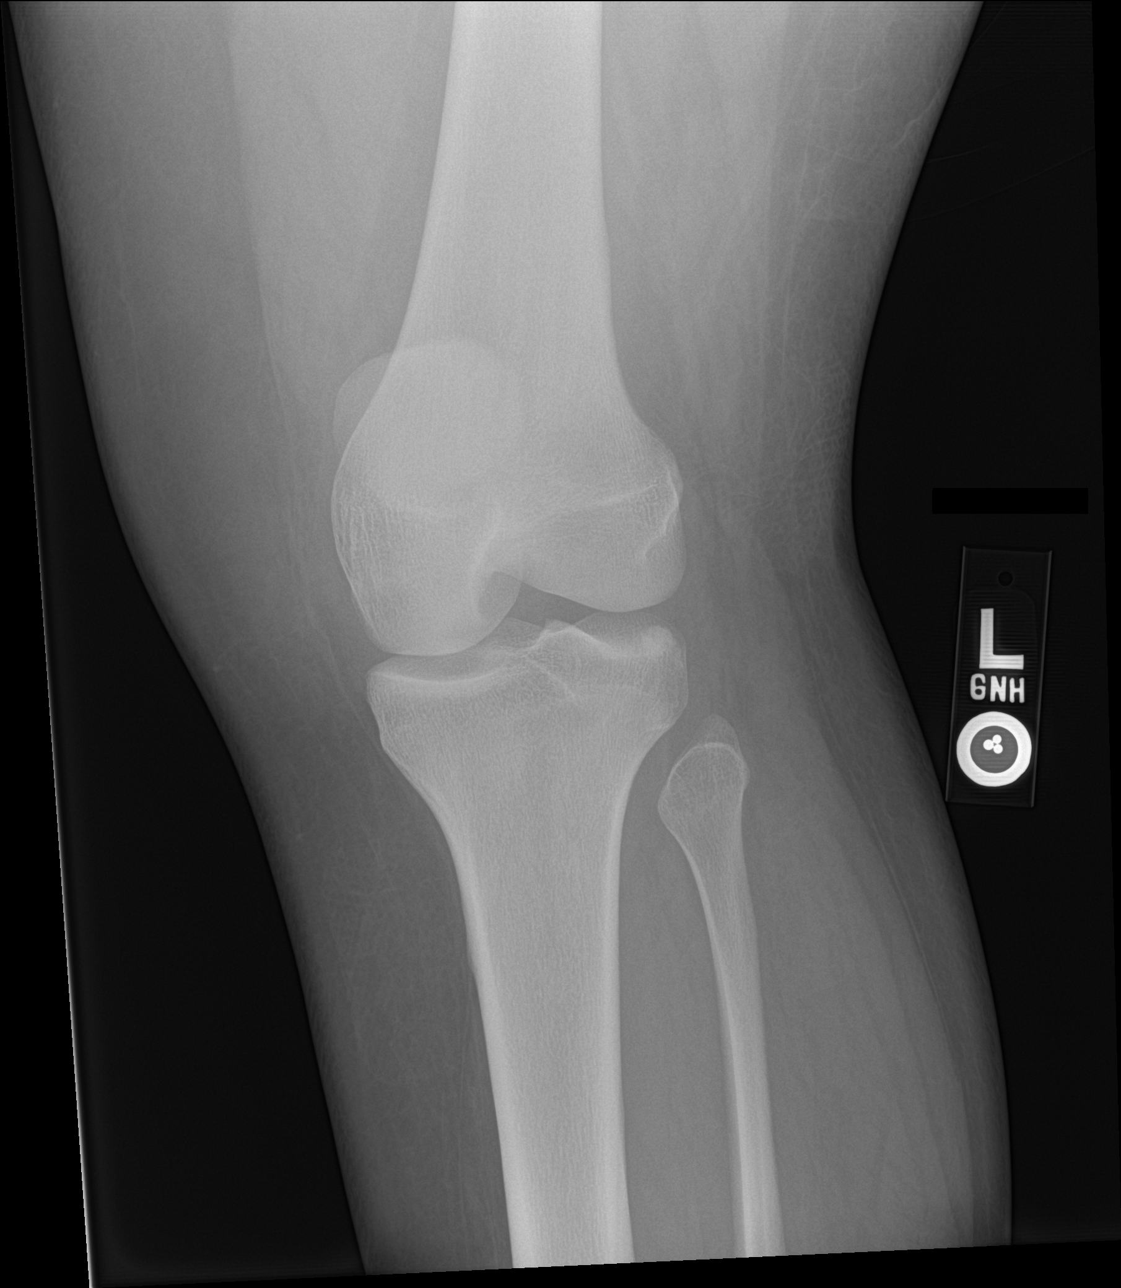

[knee obl (2 of 2)]
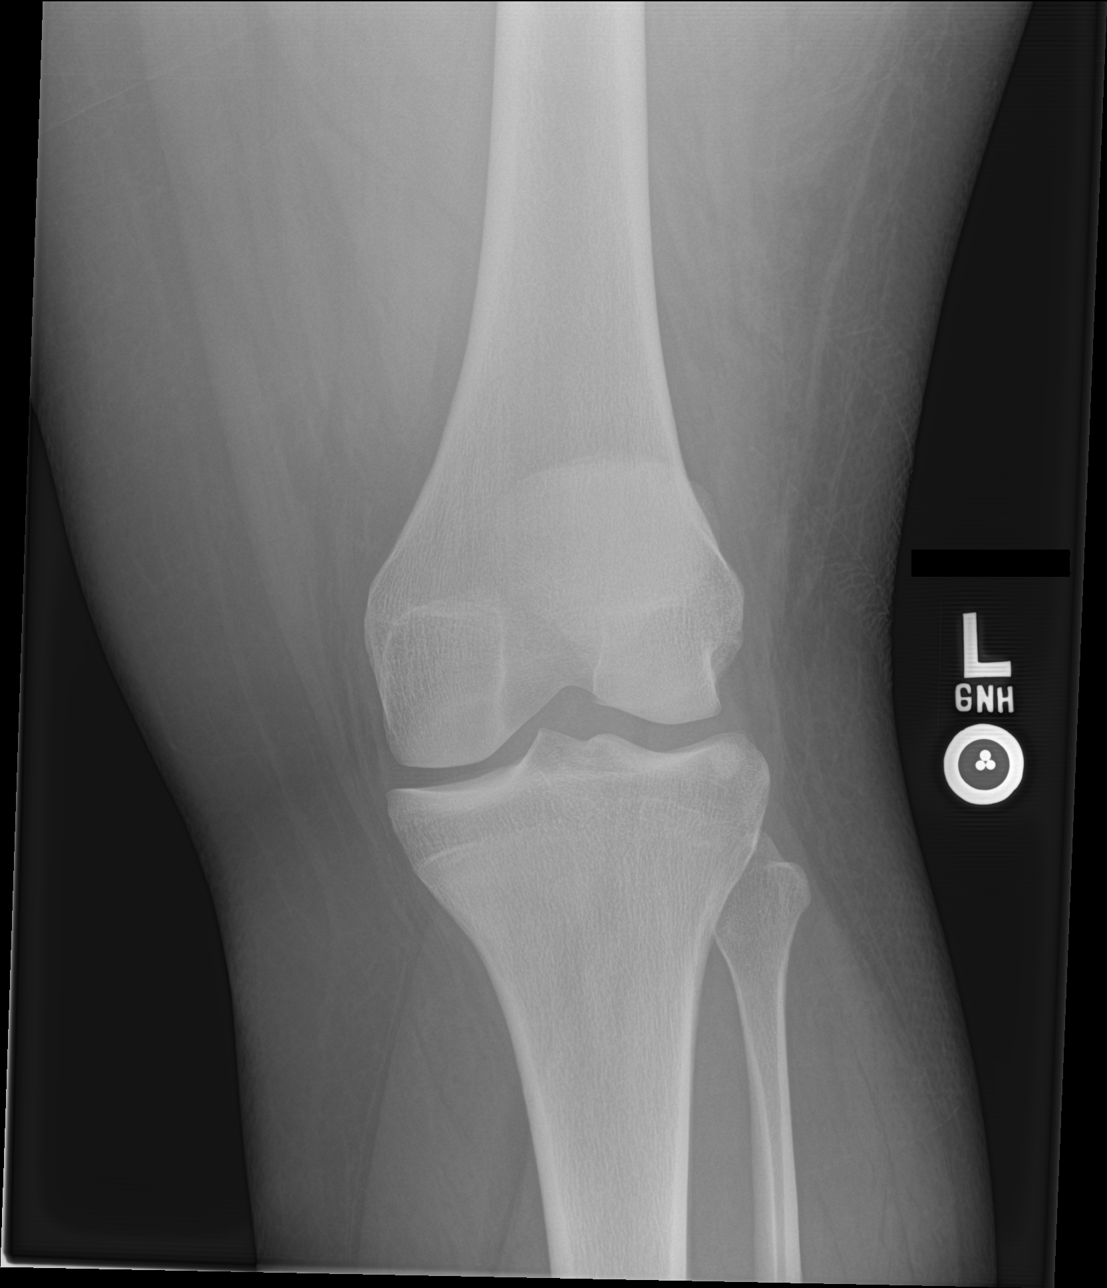

[knee lat (1 of 2)]
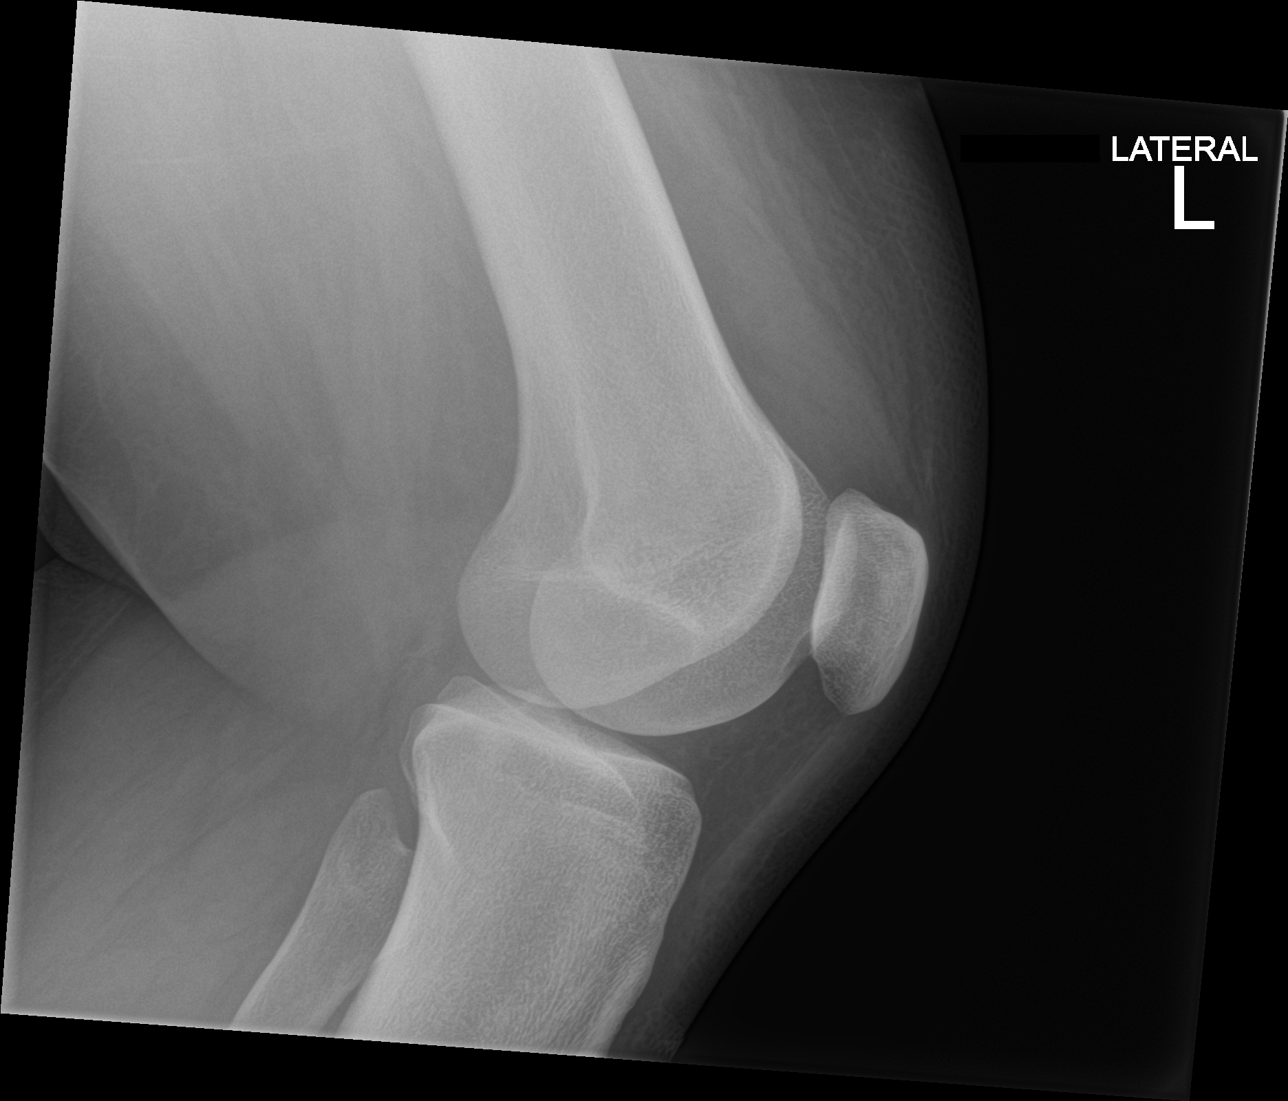

[knee lat (2 of 2)]
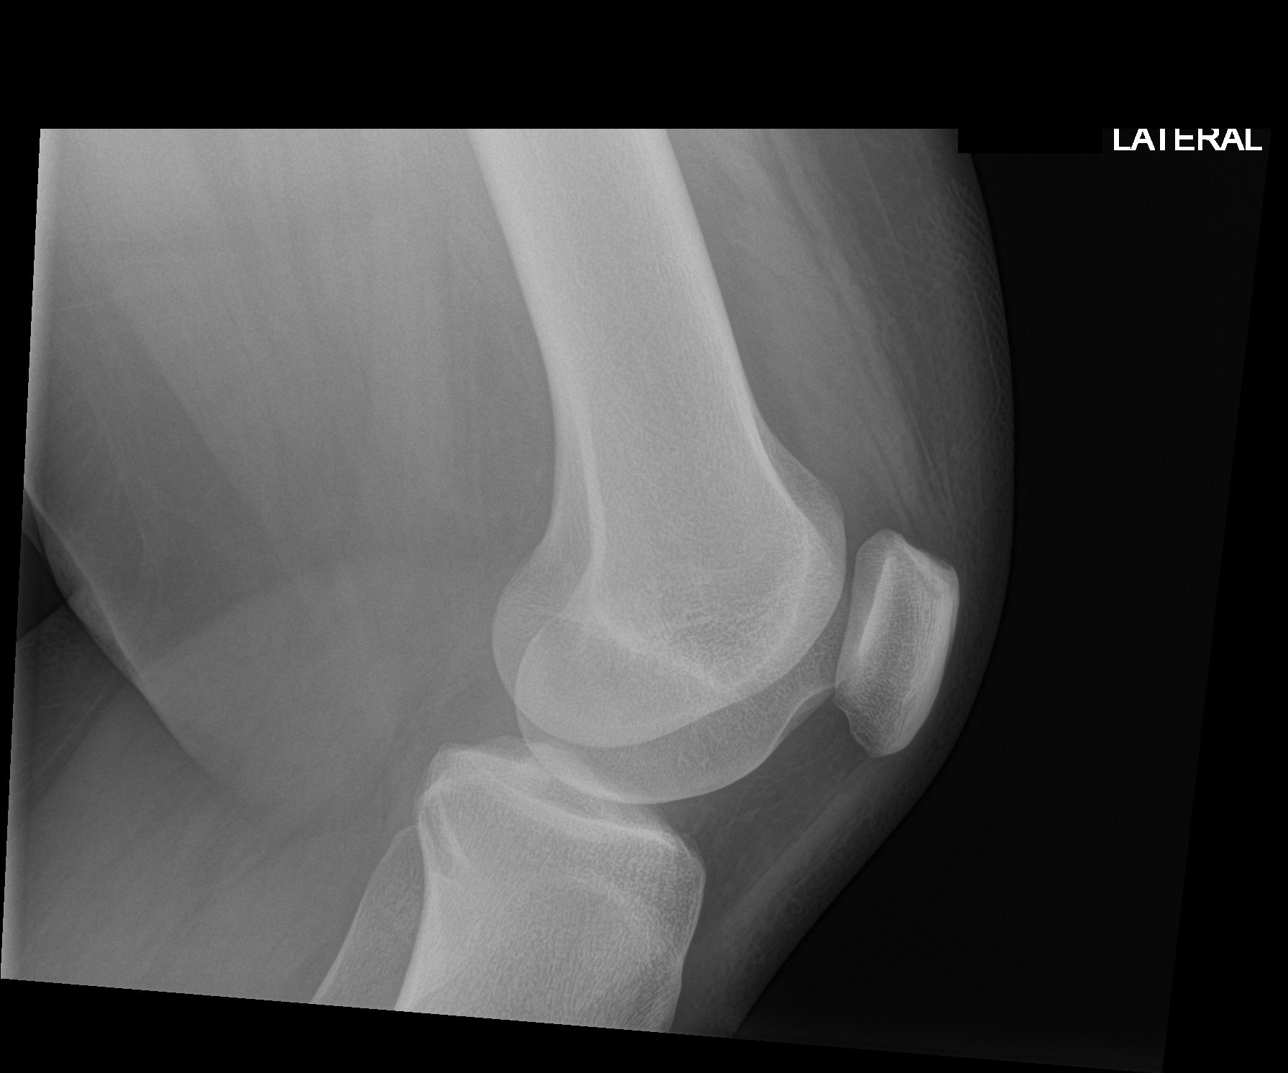

[4 of 4 positions shown; findings below may reference images not displayed]

FINDINGS: No fracture or dislocation of the left knee. Joint spaces are
preserved. There is a probable small knee joint effusion and soft
tissue edema about knee.
IMPRESSION: 1. No fracture or dislocation of the left knee. Joint spaces are
preserved.

2. There is a probable small knee joint effusion and soft tissue
edema about knee.
